# Patient Record
Sex: Female | Born: 1985 | Race: White | Hispanic: No | Marital: Married | Smoking: Never smoker
Health system: Southern US, Community
[De-identification: ages and names within clinical notes are randomized; demographics above are authoritative.]

## PROBLEM LIST (undated history)

## (undated) DIAGNOSIS — Z789 Other specified health status: Secondary | ICD-10-CM

## (undated) HISTORY — PX: NO PAST SURGERIES: SHX2092

---

## 2006-02-13 ENCOUNTER — Ambulatory Visit: Payer: Self-pay | Admitting: Internal Medicine

## 2007-03-31 ENCOUNTER — Observation Stay: Payer: Self-pay

## 2007-04-01 ENCOUNTER — Ambulatory Visit: Payer: Self-pay | Admitting: Obstetrics and Gynecology

## 2007-05-26 ENCOUNTER — Inpatient Hospital Stay: Payer: Self-pay | Admitting: Obstetrics and Gynecology

## 2013-06-08 ENCOUNTER — Emergency Department: Payer: Self-pay | Admitting: Emergency Medicine

## 2013-06-08 LAB — COMPREHENSIVE METABOLIC PANEL
Albumin: 3.9 g/dL (ref 3.4–5.0)
Alkaline Phosphatase: 78 U/L
Anion Gap: 7 (ref 7–16)
BUN: 9 mg/dL (ref 7–18)
Bilirubin,Total: 0.6 mg/dL (ref 0.2–1.0)
Calcium, Total: 9.7 mg/dL (ref 8.5–10.1)
Chloride: 104 mmol/L (ref 98–107)
Co2: 24 mmol/L (ref 21–32)
Creatinine: 0.52 mg/dL — ABNORMAL LOW (ref 0.60–1.30)
EGFR (African American): >60
EGFR (Non-African Amer.): >60
Glucose: 102 mg/dL — ABNORMAL HIGH (ref 65–99)
Osmolality: 269 (ref 275–301)
Potassium: 3.8 mmol/L (ref 3.5–5.1)
SGOT(AST): 19 U/L (ref 15–37)
SGPT (ALT): 26 U/L (ref 12–78)
Sodium: 135 mmol/L — ABNORMAL LOW (ref 136–145)
Total Protein: 8.2 g/dL (ref 6.4–8.2)

## 2013-06-08 LAB — CBC WITH DIFFERENTIAL/PLATELET
Basophil #: 0.1 10*3/uL (ref 0.0–0.1)
Basophil %: 0.3 %
Eosinophil #: 0 10*3/uL (ref 0.0–0.7)
Eosinophil %: 0 %
HCT: 42 % (ref 35.0–47.0)
HGB: 13.6 g/dL (ref 12.0–16.0)
Lymphocyte #: 0.4 10*3/uL — ABNORMAL LOW (ref 1.0–3.6)
Lymphocyte %: 2.4 %
MCH: 29.9 pg (ref 26.0–34.0)
MCHC: 32.4 g/dL (ref 32.0–36.0)
MCV: 92 fL (ref 80–100)
Monocyte #: 0.4 x10 3/mm (ref 0.2–0.9)
Monocyte %: 2.3 %
Neutrophil #: 15.6 10*3/uL — ABNORMAL HIGH (ref 1.4–6.5)
Neutrophil %: 95 %
Platelet: 208 10*3/uL (ref 150–440)
RBC: 4.55 10*6/uL (ref 3.80–5.20)
RDW: 13.2 % (ref 11.5–14.5)
WBC: 16.5 10*3/uL — ABNORMAL HIGH (ref 3.6–11.0)

## 2013-06-08 LAB — URINALYSIS, COMPLETE
Bilirubin,UR: NEGATIVE
Blood: NEGATIVE
Glucose,UR: NEGATIVE mg/dL (ref 0–75)
Leukocyte Esterase: NEGATIVE
Nitrite: NEGATIVE
Ph: 5 (ref 4.5–8.0)
Protein: 100
RBC,UR: 4 /HPF (ref 0–5)
Specific Gravity: 1.028 (ref 1.003–1.030)
Squamous Epithelial: 5
WBC UR: 2 /HPF (ref 0–5)

## 2013-10-29 ENCOUNTER — Inpatient Hospital Stay: Payer: Self-pay | Admitting: Obstetrics and Gynecology

## 2013-10-29 LAB — CBC WITH DIFFERENTIAL/PLATELET
Basophil #: 0.1 10*3/uL (ref 0.0–0.1)
Basophil %: 0.6 %
Eosinophil #: 0.1 10*3/uL (ref 0.0–0.7)
Eosinophil %: 0.9 %
HCT: 35 % (ref 35.0–47.0)
HGB: 11.4 g/dL — ABNORMAL LOW (ref 12.0–16.0)
Lymphocyte #: 1.8 10*3/uL (ref 1.0–3.6)
Lymphocyte %: 18.7 %
MCH: 28.3 pg (ref 26.0–34.0)
MCHC: 32.6 g/dL (ref 32.0–36.0)
MCV: 87 fL (ref 80–100)
Monocyte #: 0.7 x10 3/mm (ref 0.2–0.9)
Monocyte %: 7 %
Neutrophil #: 7.1 10*3/uL — ABNORMAL HIGH (ref 1.4–6.5)
Neutrophil %: 72.8 %
Platelet: 209 10*3/uL (ref 150–440)
RBC: 4.04 10*6/uL (ref 3.80–5.20)
RDW: 14.6 % — ABNORMAL HIGH (ref 11.5–14.5)
WBC: 9.8 10*3/uL (ref 3.6–11.0)

## 2013-10-31 LAB — CBC WITH DIFFERENTIAL/PLATELET
Basophil #: 0.1 10*3/uL (ref 0.0–0.1)
Basophil %: 0.5 %
Eosinophil #: 0.1 10*3/uL (ref 0.0–0.7)
Eosinophil %: 0.7 %
HCT: 32.5 % — ABNORMAL LOW (ref 35.0–47.0)
HGB: 10.7 g/dL — ABNORMAL LOW (ref 12.0–16.0)
Lymphocyte #: 2 10*3/uL (ref 1.0–3.6)
Lymphocyte %: 17.1 %
MCH: 28 pg (ref 26.0–34.0)
MCHC: 32.8 g/dL (ref 32.0–36.0)
MCV: 86 fL (ref 80–100)
Monocyte #: 0.8 x10 3/mm (ref 0.2–0.9)
Monocyte %: 7.2 %
Neutrophil #: 8.6 10*3/uL — ABNORMAL HIGH (ref 1.4–6.5)
Neutrophil %: 74.5 %
Platelet: 180 10*3/uL (ref 150–440)
RBC: 3.8 10*6/uL (ref 3.80–5.20)
RDW: 14.3 % (ref 11.5–14.5)
WBC: 11.5 10*3/uL — ABNORMAL HIGH (ref 3.6–11.0)

## 2013-11-02 LAB — HEMATOCRIT: HCT: 25 % — ABNORMAL LOW (ref 35.0–47.0)

## 2014-03-01 ENCOUNTER — Other Ambulatory Visit: Payer: BC Managed Care – PPO

## 2014-03-01 ENCOUNTER — Encounter: Payer: Self-pay | Admitting: General Surgery

## 2014-03-01 ENCOUNTER — Ambulatory Visit (INDEPENDENT_AMBULATORY_CARE_PROVIDER_SITE_OTHER): Payer: BC Managed Care – PPO | Admitting: General Surgery

## 2014-03-01 VITALS — BP 100/72 | HR 68 | Resp 12 | Ht 65.0 in | Wt 165.0 lb

## 2014-03-01 DIAGNOSIS — N63 Unspecified lump in unspecified breast: Secondary | ICD-10-CM

## 2014-03-01 NOTE — Patient Instructions (Signed)
6 week follow up

## 2014-03-01 NOTE — Progress Notes (Signed)
Patient ID: Natalie Aguirre, female   DOB: 08-Sep-1985, 28 y.o.   MRN: [id number]  Chief Complaint  Patient presents with  . Other    right breast mass    HPI Natalie Aguirre is a 28 y.o. female who presents for a right breast mass that has been present for approximately 4 months, around same time she gave child birth. Patient is not breast feeding. She denies any pain or nipple discharge. She thinks the area has gotten larger but not totally sure. She has a family history of a maternal great aunt with breast cancer diagnosed in her forties. No prior breast problems.   HPI  History reviewed. No pertinent past medical history.  History reviewed. No pertinent past surgical history.  Family History  Problem Relation Age of Onset  . Cancer Maternal Aunt 25    great aunt    Social History History  Substance Use Topics  . Smoking status: Never Smoker   . Smokeless tobacco: Never Used  . Alcohol Use: Yes    Allergies  Allergen Reactions  . Codeine Palpitations    No current outpatient prescriptions on file.   No current facility-administered medications for this visit.    Review of Systems Review of Systems  Constitutional: Negative.   Respiratory: Negative.   Cardiovascular: Negative.     Blood pressure 100/72, pulse 68, resp. rate 12, height 5\' 5"  (1.651 m), weight 165 lb (74.844 kg), last menstrual period 02/19/2014.  Physical Exam Physical Exam  Constitutional: She is oriented to person, place, and time. She appears well-developed and well-nourished.  Eyes: Conjunctivae are normal. No scleral icterus.  Neck: Neck supple. No thyromegaly present.  Cardiovascular: Normal rate, regular rhythm and normal heart sounds.   Pulmonary/Chest: Effort normal and breath sounds normal. Right breast exhibits mass (1cm smooth ill defined mass, 7-8oclock, subareolar). Right breast exhibits no inverted nipple, no nipple discharge and no skin change. Left breast exhibits no inverted  nipple, no mass, no nipple discharge and no skin change.    Abdominal: Soft. Bowel sounds are normal.  Lymphadenopathy:    She has no cervical adenopathy.    She has no axillary adenopathy.  Neurological: She is alert and oriented to person, place, and time.  Skin: Skin is warm and dry.    Data Reviewed [place] R breast performed, showing some non specific mildly dilated ducts. No defined mass. No shadowing  Assessment   Likely enlarged ducts of benign nature. Discussed fully with pt and she is agreeable to continued follow up.    Plan    6 week follow up for exam and possible [place]. Pt aware to call if she notes any pain, increase in size or any bloody/watery nipple drainage.       Natalie Aguirre 03/01/2014, 3:50 PM

## 2014-04-12 ENCOUNTER — Ambulatory Visit (INDEPENDENT_AMBULATORY_CARE_PROVIDER_SITE_OTHER): Payer: BLUE CROSS/BLUE SHIELD | Admitting: General Surgery

## 2014-04-12 ENCOUNTER — Ambulatory Visit: Payer: Self-pay

## 2014-04-12 ENCOUNTER — Encounter: Payer: Self-pay | Admitting: General Surgery

## 2014-04-12 ENCOUNTER — Ambulatory Visit: Payer: BC Managed Care – PPO | Admitting: General Surgery

## 2014-04-12 VITALS — BP 122/76 | HR 78 | Resp 12 | Ht 65.0 in | Wt 164.0 lb

## 2014-04-12 DIAGNOSIS — N63 Unspecified lump in unspecified breast: Secondary | ICD-10-CM

## 2014-04-12 DIAGNOSIS — N6310 Unspecified lump in the right breast, unspecified quadrant: Secondary | ICD-10-CM

## 2014-04-12 NOTE — Patient Instructions (Addendum)
Patient to return in six months right breast ultrasound. Continue self breast exams. Call office for any new breast issues or concerns.

## 2014-04-12 NOTE — Progress Notes (Signed)
Patient ID: Natalie Aguirre, female   DOB: 01-Nov-1985, 29 y.o.   MRN: [id number]  Chief Complaint  Patient presents with  . Follow-up    right breast mass    HPI Natalie Aguirre is a 29 y.o. female. here today for her follow up right breast mass. Patient states she still feels the area and it has not got bigger since her last visit. No pain or any discharge HPI  History reviewed. No pertinent past medical history.  History reviewed. No pertinent past surgical history.  Family History  Problem Relation Age of Onset  . Cancer Maternal Aunt 19    great aunt    Social History History  Substance Use Topics  . Smoking status: Never Smoker   . Smokeless tobacco: Never Used  . Alcohol Use: Yes    Allergies  Allergen Reactions  . Codeine Palpitations    No current outpatient prescriptions on file.   No current facility-administered medications for this visit.    Review of Systems Review of Systems  Blood pressure 122/76, pulse 78, resp. rate 12, height 5\' 5"  (1.651 m), weight 164 lb (74.39 kg).  Physical Exam Physical Exam  Constitutional: She appears well-developed.  Eyes: Conjunctivae are normal. No scleral icterus.  Pulmonary/Chest: Right breast exhibits mass ( 1cm smooth ill defined mass, 7-8oclock, subareolar). Right breast exhibits no inverted nipple, no nipple discharge, no skin change and no tenderness. Left breast exhibits no inverted nipple, no mass, no nipple discharge, no skin change and no tenderness.  Lymphadenopathy:    She has no cervical adenopathy.    She has no axillary adenopathy.  Right breast finding is unchanged  Data Reviewed Right breast mass no changes since last visit.   Assessment    [place] R breast performed, showing normal fibroglandular tissue and few non dilated ducts. No defined mass or cyst seen     Plan    Patient to return in six months for recheck    Continue self breast exams. Call office for any new breast issues or  concerns.     Camri Molloy G 04/12/2014, 11:20 AM

## 2014-04-26 ENCOUNTER — Ambulatory Visit: Payer: Self-pay | Admitting: General Surgery

## 2014-08-10 NOTE — H&P (Signed)
L&D Evaluation:  History Expanded:  HPI 29 yo G2P1001 at [redacted]w[redacted]d gestational age by LMP consistent with 10 week ultrasound.  Pregnancy complicated by low AFI today in office (~4.5cm).  She also had a non-reactive NST today.  She was sent from the office for management. She reports positive fetal movement, no leakage of fluid, no vaginal bleeding. She has rare contractions.   Group B Strep Results Maternal (Result >5wks must be treated as unknown) negative  10/01/13   [facility] 27-Oct-2013   Patient's Medical History No Chronic Illness   Patient's Surgical History none   Medications Pre Natal Vitamins   Allergies codeine   Social History none   Family History Non-Contributory   ROS:  ROS All systems were reviewed.  HEENT, CNS, GI, GU, Respiratory, CV, Renal and Musculoskeletal systems were found to be normal., unless noted in HPI   Exam:  Vital Signs afebrile, normotensive, otherwise normal vital signs   General no apparent distress   Mental Status clear   Chest clear   Heart normal sinus rhythm   Abdomen gravid, non-tender   Estimated Fetal Weight 7.5-8 pounds   Fetal Position c   Back no CVAT   Edema no edema   Pelvic FT   Mebranes Intact   FHT normal rate with no decels   FHT Description 125/mod var/+accels/no decels   Ucx irregular   Skin no lesions   Impression:  Impression 1) Intrauterine pregnancy at [redacted]w[redacted]d gestational age, 2) oligohydramnios   Plan:  Comments 1) Labor: Induction of labor with cervadil  2) Fetus - category I tracing  3) PNL O positive / ABSC negative / RI / VZI / HIV neg / RPR NR / HBsAg neg / declined genetic testing / 1-hr OGTT 97 / GBS negative  4) TDAP declined  5) Disposition - home postpartum  Pt has been fully informed of the pros and cons, risk/benefits continued observation with NST/AFI versus the risks of induction.  She understands that there is an increasing risk to the fetus with low AFI at current gestational age.  She also understands that there are uncommon risks to induction, which include but are not limited to:  frequent and/or prolonged uterine contractions, fetal distress, uterine rupture and lack of success of induction.  These risks involve all methods of induction whether Pitocin or Misoprostol She also has been informed that if the induction is not successful a Cesarean Section may be necessary.   Electronic Signatures: Will Bonnet (MD)  (Signed 30-Jul-15 20:31)  Authored: L&D Evaluation   Last Updated: 30-Jul-15 20:31 by Will Bonnet (MD)

## 2014-10-12 ENCOUNTER — Ambulatory Visit: Payer: BLUE CROSS/BLUE SHIELD | Admitting: General Surgery

## 2014-11-25 ENCOUNTER — Encounter: Payer: Self-pay | Admitting: *Deleted

## 2016-07-25 ENCOUNTER — Emergency Department: Payer: BLUE CROSS/BLUE SHIELD

## 2016-07-25 ENCOUNTER — Encounter: Payer: Self-pay | Admitting: *Deleted

## 2016-07-25 ENCOUNTER — Emergency Department
Admission: EM | Admit: 2016-07-25 | Discharge: 2016-07-25 | Disposition: A | Discharge status: Home / Self Care | Payer: BLUE CROSS/BLUE SHIELD | Attending: Emergency Medicine | Admitting: Emergency Medicine

## 2016-07-25 DIAGNOSIS — M542 Cervicalgia: Secondary | ICD-10-CM | POA: Diagnosis not present

## 2016-07-25 DIAGNOSIS — Y9389 Activity, other specified: Secondary | ICD-10-CM | POA: Diagnosis not present

## 2016-07-25 DIAGNOSIS — Y92410 Unspecified street and highway as the place of occurrence of the external cause: Secondary | ICD-10-CM | POA: Diagnosis not present

## 2016-07-25 DIAGNOSIS — Y999 Unspecified external cause status: Secondary | ICD-10-CM | POA: Insufficient documentation

## 2016-07-25 DIAGNOSIS — S199XXA Unspecified injury of neck, initial encounter: Secondary | ICD-10-CM | POA: Diagnosis present

## 2016-07-25 MED ORDER — CYCLOBENZAPRINE HCL 5 MG PO TABS: 5.0000 mg | ORAL_TABLET | Freq: Three times a day (TID) | ORAL | 0 refills | Status: AC | PRN

## 2016-07-25 MED ORDER — MELOXICAM 7.5 MG PO TABS
7.5000 mg | ORAL_TABLET | Freq: Every day | ORAL | Status: DC
  Administered 2016-07-25: 7.5 mg via ORAL
  Filled 2016-07-25: qty 1

## 2016-07-25 MED ORDER — MELOXICAM 7.5 MG PO TABS: 7.5000 mg | ORAL_TABLET | Freq: Every day | ORAL | 1 refills | Status: AC

## 2016-07-25 NOTE — ED Notes (Signed)
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
## 2019-02-27 ENCOUNTER — Emergency Department: Payer: BC Managed Care – PPO

## 2019-02-27 ENCOUNTER — Other Ambulatory Visit: Payer: Self-pay

## 2019-02-27 ENCOUNTER — Emergency Department
Admission: EM | Admit: 2019-02-27 | Discharge: 2019-02-27 | Disposition: A | Discharge status: Home / Self Care | Payer: BC Managed Care – PPO | Attending: Emergency Medicine | Admitting: Emergency Medicine

## 2019-02-27 DIAGNOSIS — S99912A Unspecified injury of left ankle, initial encounter: Secondary | ICD-10-CM | POA: Diagnosis present

## 2019-02-27 DIAGNOSIS — W1842XA Slipping, tripping and stumbling without falling due to stepping into hole or opening, initial encounter: Secondary | ICD-10-CM | POA: Insufficient documentation

## 2019-02-27 DIAGNOSIS — S93492A Sprain of other ligament of left ankle, initial encounter: Secondary | ICD-10-CM | POA: Diagnosis not present

## 2019-02-27 DIAGNOSIS — Y9389 Activity, other specified: Secondary | ICD-10-CM | POA: Insufficient documentation

## 2019-02-27 DIAGNOSIS — Y92008 Other place in unspecified non-institutional (private) residence as the place of occurrence of the external cause: Secondary | ICD-10-CM | POA: Insufficient documentation

## 2019-02-27 DIAGNOSIS — Y999 Unspecified external cause status: Secondary | ICD-10-CM | POA: Diagnosis not present

## 2019-02-27 MED ORDER — NAPROXEN 500 MG PO TABS
500.0000 mg | ORAL_TABLET | Freq: Once | ORAL | Status: AC
  Administered 2019-02-27: 500 mg via ORAL
  Filled 2019-02-27: qty 1

## 2019-02-27 NOTE — ED Notes (Signed)
Imaging at bedside. Pt given warm blanket.

## 2019-02-27 NOTE — Discharge Instructions (Signed)
Take Naprosyn or ibuprofen for pain. Rest, ice, and elevate over the next few days. Follow up with podiatry if not improving over the week. Return to the ER for symptoms of concern if unable to see primary care or the podiatrist.

## 2019-02-27 NOTE — ED Provider Notes (Signed)
[facility] Emergency Department Provider Note ____________________________________________  Time seen: Approximately 8:15 PM  I have reviewed the triage vital signs and the nursing notes.   HISTORY  Chief Complaint Fall    HPI Caysie KEKELI ORTMAN is a 33 y.o. female who presents to the emergency department for evaluation and treatment of left ankle pain. She fell and a hole this afternoon while decorating for Christmas and twisted it. She has had a previous ankle sprain, but no fracture. No alleviating measures prior to arrival.  History reviewed. No pertinent past medical history.  Patient Active Problem List   Diagnosis Date Noted  . Breast mass, right 04/12/2014    History reviewed. No pertinent surgical history.  Prior to Admission medications   Not on File    Allergies Codeine  Family History  Problem Relation Age of Onset  . Cancer Maternal Aunt 67       great aunt    Social History Social History   Tobacco Use  . Smoking status: Never Smoker  . Smokeless tobacco: Never Used  Substance Use Topics  . Alcohol use: Yes    Comment: occasional   . Drug use: No    Review of Systems Constitutional: Negative for fever. Cardiovascular: Negative for chest pain. Respiratory: Negative for shortness of breath. Musculoskeletal: Positive for left ankle pain. Positive for left ankle swelling. Skin: Negative for open wounds.  Neurological: Negative for decrease in sensation  ____________________________________________   PHYSICAL EXAM:  VITAL SIGNS: ED Triage Vitals  Enc Vitals Group     BP 02/27/19 1950 134/72     Pulse Rate 02/27/19 1950 78     Resp 02/27/19 1950 18     Temp 02/27/19 1950 98.3 F (36.8 C)     Temp Source 02/27/19 1950 Oral     SpO2 02/27/19 1950 100 %     Weight 02/27/19 1949 173 lb (78.5 kg)     Height 02/27/19 1949 5\' 5"  (1.651 m)     Head Circumference --      Peak Flow --      Pain Score 02/27/19 1949 7   Pain Loc --      Pain Edu? --      Excl. in [place]? --     Constitutional: Alert and oriented. Well appearing and in no acute distress. Eyes: Conjunctivae are clear without discharge or drainage Head: Atraumatic Neck: Supple Respiratory: No cough. Respirations are even and unlabored. Musculoskeletal: ATFL pattern swelling and tenderness. No knee or hip pain. Neurologic: Motor and sensory function is intact.  Skin: No open wounds over left foot or ankle.  Psychiatric: Affect and behavior are appropriate.  ____________________________________________   LABS (all labs ordered are listed, but only abnormal results are displayed)  Labs Reviewed - No data to display ____________________________________________  RADIOLOGY  Left ankle image is negative for acute fracture. ____________________________________________   PROCEDURES  .Splint Application  Date/Time: 02/27/2019 8:20 PM Performed by: Jarvis Morgan, RN Authorized by: Victorino Dike, FNP   Consent:    Consent obtained:  Verbal   Consent given by:  Patient Pre-procedure details:    Sensation:  Normal Procedure details:    Laterality:  Left   Location:  Ankle   Splint type:  Ankle stirrup   Supplies:  Prefabricated splint Post-procedure details:    Pain:  Improved   Sensation:  Normal   Patient tolerance of procedure:  Tolerated well, no immediate complications   ____________________________________________   INITIAL IMPRESSION /  ASSESSMENT AND PLAN / ED COURSE  Odessie TANIELLE ROESKE is a 33 y.o. who presents to the emergency department for treatment and evaluation of left ankle pain after stepping in a hole in her yard this afternoon. See HPI for further details.   Patient instructed to follow-up with podiatry.  She was also instructed to return to the emergency department for symptoms that change or worsen if unable schedule an appointment with orthopedics or primary care.  Medications  naproxen (NAPROSYN) tablet  500 mg (500 mg Oral Given 02/27/19 2019)    Pertinent labs & imaging results that were available during my care of the patient were reviewed by me and considered in my medical decision making (see chart for details).  _________________________________________   FINAL CLINICAL IMPRESSION(S) / ED DIAGNOSES  Final diagnoses:  Sprain of anterior talofibular ligament of left ankle, initial encounter    ED Discharge Orders    None       If controlled substance prescribed during this visit, 12 month history viewed on the [place] prior to issuing an initial prescription for Schedule II or III opiod.   Victorino Dike, FNP 02/27/19 2027    Arta Silence, MD 02/27/19 2227

## 2019-02-27 NOTE — ED Notes (Signed)
Pt in with L ankle pain following fall into hole in the yard. Unsure if rolled ankle. States painful to try and put weight on. Can move ankle/foot but with inc pain. Slight swelling noted. Pulse 2+ on injured foot.

## 2019-12-21 ENCOUNTER — Ambulatory Visit: Payer: BC Managed Care – PPO | Admitting: Dietician

## 2019-12-25 ENCOUNTER — Encounter: Payer: Self-pay | Admitting: Dietician

## 2019-12-25 NOTE — Progress Notes (Signed)
Have not yet heard back from patient to reschedule her cancelled appointment from 12/21/19. Sent notification to referring provider.

## 2020-01-04 ENCOUNTER — Other Ambulatory Visit: Payer: Self-pay | Admitting: Obstetrics & Gynecology

## 2020-01-04 NOTE — Progress Notes (Unsigned)
Chief Complaint:   Chief Complaint  Patient presents with  . Abnormal Pap Smear    HPI:  Natalie Aguirre is a 34 y.o. female here for Abnormal Pap Smear  Patient was seen 12/21/19 for colposcopy due to ASC-H pap HPV+. At time of the procedure, two areas of acetowhite changes were seen between 11:00 to 1:00 at the junction of the tranzition zone and 6:00 to 8:00 on the posterior cervix. Biopsies were taken at 12:00 and 7:30. An ECC was also done.   Pathology Showed: Specimen A- 7:00 Biopsy: BENIGN CERVICAL TISSUE. NEGATIVE FOR DYSPLASIA AND MALIGNANCY.  Specimen B- 12:00 Biopsy: LOW-GRADE SQUAMOUS INTRAEPITHELIAL LESION (CIN 1).  Specimen C- ECC: STRIPS OF SQUAMOUS  EPITHELIUM WITH A HIGH GRADE SQUAMOUS INTRAEPITHELIAL  LESION (CIN 2-3).  No known kidney, lung or heart problems.  Never had problems with anesthesia   No LMP recorded. (Menstrual status: IUD).    Past Medical History:  has no past medical history on file.  Past Surgical History:  has no past surgical history on file. Family History: family history includes Alcohol abuse in her father; Coronary Artery Disease (Blocked arteries around heart) in her maternal grandfather and maternal grandmother; High blood pressure (Hypertension) in her maternal grandmother; Hyperlipidemia (Elevated cholesterol) in her maternal grandfather; No Known Problems in her brother, daughter, daughter, mother, sister, and son. Social History:  reports that she has never smoked. She has never used smokeless tobacco. She reports current alcohol use. She reports that she does not use drugs. OB/GYN History:  OB History   No obstetric history on file.     Allergies: is allergic to codeine. Medications:  Current Outpatient Medications:  .  levonorgestreL (MIRENA 52 MG) 20 mcg/24 hr (6 years) IUD, Insert 1 each into the uterus once Follow package directions., Disp: , Rfl:  .  metroNIDAZOLE (METROGEL) 1 % gel, Apply topically once daily, Disp: 45 g,  Rfl: 2  Review of Systems (positives per HPI): General:   No fatigue or weight loss Eyes:   No vision changes Ears:   No hearing difficulty Respiratory:                No cough or shortness of breath Cardiovascular:        No chest pain, palpitations, dyspnea on exertion Gastrointestinal:          No abdominal bloating, chronic diarrhea, constipation, masses, pain or hematochezia Genitourinary:  No hematuria, dysuria, abnormal vaginal discharge, pelvic pain, abnormal bleeding Lymphatic:  No swollen lymph nodes Musculoskeletal: No muscle weakness Neurologic:  No extremity weakness, syncope Psychiatric:  No delusions or suicidal/homicidal ideation    Exam:   Constitutional: BP 120/84   Pulse 84   Ht 165.1 cm (5\' 5" )   Wt 84.2 kg (185 lb 9.6 oz)   BMI 30.89 kg/m    WDWN female in NAD   HEENT: sclera clear, non-icteric, moist mucous membranes  Endocrine:  no thyromegaly Respiratory: normal respiratory effort, CTABL    CV: no peripheral edema, RRR no MRG Skin: warm and well perfused, no rashes Neuro: alert, oriented x3,   Psych: appropriate mood and insight, judgement intact  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  Plan:   Chronic Medical Conditions There is no problem list on file for this patient.  We discussed  at length the nature of cervical changes and CIN.  CIN 2-3 is intermediary and if left alone does have a significant risk of becoming cancerous.  The recommended next step is excision procedure to the affected areas of the cervix. Preop exam complete.   Recommended procedure: Cold knife conization of the cervix Desires a hysterectomy after procedure pathology returns.   The patient and I discussed the technical aspects of the  procedure including the potential for risks and complications.These include but are not limited to the risk of infection requiring post-operative antibiotics or further procedures.We talked about the risk of injury to adjacent organs. Possibleneed for subsequent procedure or blood transfusion andpostop complications such asthromboembolic or cardiopulmonary complications.Reviewed risks for preterm delivery of subsequent pregnancies.All of her questions were answered. Her preoperative exam was completed andthe appropriate consents will be signed day of. She will be scheduled to undergo this procedure in the near future.    Return if symptoms worsen or fail to improve.  I personally performed the service. (TP)  Vernis Eid CRIST Nimra Puccinelli, MD

## 2020-01-12 ENCOUNTER — Other Ambulatory Visit: Payer: Self-pay | Admitting: Obstetrics & Gynecology

## 2020-01-16 NOTE — H&P (Signed)
Chief Complaint:      Chief Complaint  Patient presents with  . Abnormal Pap Smear    HPI:  Natalie Aguirre is a 34 y.o. female here for Abnormal Pap Smear  Patient was seen 12/21/19 for colposcopy due to ASC-H pap HPV+. At time of the procedure, two areas of acetowhite changes were seen between 11:00 to 1:00 at the junction of the tranzition zone and 6:00 to 8:00 on the posterior cervix. Biopsies were taken at 12:00 and 7:30. An ECC was also done.   Pathology Showed: Specimen A- 7:00 Biopsy: BENIGN CERVICAL TISSUE. NEGATIVE FOR DYSPLASIA AND MALIGNANCY.  Specimen B- 12:00 Biopsy: LOW-GRADE SQUAMOUS INTRAEPITHELIAL LESION (CIN 1).  Specimen C- ECC: STRIPS OF SQUAMOUS  EPITHELIUM WITH A HIGH GRADE SQUAMOUS INTRAEPITHELIAL  LESION (CIN 2-3).  No known kidney, lung or heart problems.  Never had problems with anesthesia   No LMP recorded. (Menstrual status: IUD).    Past Medical History:  has no past medical history on file.  Past Surgical History:  has no past surgical history on file. Family History: family history includes Alcohol abuse in her father; Coronary Artery Disease (Blocked arteries around heart) in her maternal grandfather and maternal grandmother; High blood pressure (Hypertension) in her maternal grandmother; Hyperlipidemia (Elevated cholesterol) in her maternal grandfather; No Known Problems in her brother, daughter, daughter, mother, sister, and son. Social History:  reports that she has never smoked. She has never used smokeless tobacco. She reports current alcohol use. She reports that she does not use drugs. OB/GYN History:  OB History   No obstetric history on file.     Allergies: is allergic to codeine. Medications:  Current Outpatient Medications:  .  levonorgestreL (MIRENA 52 MG) 20 mcg/24 hr (6 years) IUD, Insert 1 each into the uterus once Follow package directions., Disp: , Rfl:  .  metroNIDAZOLE (METROGEL) 1 % gel, Apply topically once  daily, Disp: 45 g, Rfl: 2  Review of Systems (positives per HPI): General:                      No fatigue or weight loss Eyes:                           No vision changes Ears:                            No hearing difficulty Respiratory:                No cough or shortness of breath Cardiovascular:           No chest pain, palpitations, dyspnea on exertion Gastrointestinal:          No abdominal bloating, chronic diarrhea, constipation, masses, pain or hematochezia Genitourinary:             No hematuria, dysuria, abnormal vaginal discharge, pelvic pain, abnormal bleeding Lymphatic:                   No swollen lymph nodes Musculoskeletal:         No muscle weakness Neurologic:                  No extremity weakness, syncope Psychiatric:                  No delusions or suicidal/homicidal ideation    Exam:   Constitutional: BP  120/84   Pulse 84   Ht 165.1 cm (5\' 5" )   Wt 84.2 kg (185 lb 9.6 oz)   BMI 30.89 kg/m    WDWN female in NAD   HEENT: sclera clear, non-icteric, moist mucous membranes  Endocrine:  no thyromegaly Respiratory: normal respiratory effort, CTABL    CV: no peripheral edema, RRR no MRG Skin: warm and well perfused, no rashes Neuro: alert, oriented x3,   Psych: appropriate mood and insight, judgement intact  Pelvic exam was performed at time of colposcopy and unremarkable except as described above.  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  Plan:   Chronic Medical Conditions There is no problem list on file for this patient.  We discussed at length the nature of cervical changes and CIN. CIN 2-3 is intermediary and if left alone does have a significant risk of becoming cancerous. The recommended next step is  excision procedure to the affected areas of the cervix. Preop exam complete.   Recommended procedure: Cold knife conization of the cervix Desires a hysterectomy after procedure pathology returns.   The patient and I discussed the technical aspects of the procedure including the potential for risks and complications.These include but are not limited to the risk of infection requiring post-operative antibiotics or further procedures.We talked about the risk of injury to adjacent organs. Possibleneed for subsequent procedure or blood transfusion andpostop complications such asthromboembolic or cardiopulmonary complications.Reviewed risks for preterm delivery of subsequent pregnancies.All of her questions were answered. Her preoperative exam was completed andthe appropriate consents will be signed day of. She will be scheduled to undergo this procedure in the near future.     I personally performed the service. (TP)  Elmer Merwin CRIST Ellery Tash, MD    A portion of this note was dictated via scribe. The plan and decision making are mine.  Errors are unintentional.

## 2020-01-21 ENCOUNTER — Other Ambulatory Visit: Payer: Self-pay

## 2020-01-21 ENCOUNTER — Encounter
Admission: RE | Admit: 2020-01-21 | Discharge: 2020-01-21 | Disposition: A | Discharge status: Home / Self Care | Payer: BC Managed Care – PPO | Source: Ambulatory Visit | Attending: Obstetrics & Gynecology | Admitting: Obstetrics & Gynecology

## 2020-01-21 HISTORY — DX: Other specified health status: Z78.9

## 2020-01-21 NOTE — Patient Instructions (Signed)
Your procedure is scheduled IR:[id number] 10/29 Report to Day Surgery. To find out your arrival time please call [phone] between 1PM - 3PM on thurs 10/28.  Remember: Instructions that are not followed completely may result in serious medical risk,  up to and including death, or upon the discretion of your surgeon and anesthesiologist your  surgery may need to be rescheduled.     _X__ 1. Do not eat food after midnight the night before your procedure.                 No chewing gum or hard candies. You may drink clear liquids up to 2 hours                 before you are scheduled to arrive for your surgery- DO not drink clear                 liquids within 2 hours of the start of your surgery.                 Clear Liquids include:  water, apple juice without pulp, clear Gatorade, G2 or                  Gatorade Zero (avoid Red/Purple/Blue), Black Coffee or Tea (Do not add                 anything to coffee or tea). _____2.   Complete the "Ensure Clear Pre-surgery Clear Carbohydrate Drink" provided to you, 2 hours before arrival. **If you       are diabetic you will be provided with an alternative drink, Gatorade Zero or G2.  __X__2.  On the morning of surgery brush your teeth with toothpaste and water, you                may rinse your mouth with mouthwash if you wish.  Do not swallow any toothpaste of mouthwash.     _X__ 3.  No Alcohol for 24 hours before or after surgery.   ___ 4.  Do Not Smoke or use e-cigarettes For 24 Hours Prior to Your Surgery.                 Do not use any chewable tobacco products for at least 6 hours prior to                 Surgery.  ___  5.  Do not use any recreational drugs (marijuana, cocaine, heroin, ecstasy, MDMA or other)                For at least one week prior to your surgery.  Combination of these drugs with anesthesia                May have life threatening results.  ____  6.  Bring all medications with you on the day of  surgery if instructed.   __x__  7.  Notify your doctor if there is any change in your medical condition      (cold, fever, infections).     Do not wear jewelry, make-up, hairpins, clips or nail polish. Do not wear lotions, powders, or perfumes. You may wear deodorant. Do not shave 48 hours prior to surgery. Do not bring valuables to the hospital.    [facility] is not responsible for any belongings or valuables.  Contacts, dentures or bridgework may not be worn into surgery. Leave your suitcase in the car. After surgery it  may be brought to your room. For patients admitted to the hospital, discharge time is determined by your treatment team.   Patients discharged the day of surgery will not be allowed to drive home.   Make arrangements for someone to be with you for the first 24 hours of your Same Day Discharge.    Please read over the following fact sheets that you were given:     ____ Take these medicines the morning of surgery with A SIP OF WATER:    1. none  2.   3.   4.  5.  6.  ____ Fleet Enema (as directed)   _x___ Shower with your usual products the night before and the morning of the surgery  ____ Use Benzoyl Peroxide Gel as instructed  ____ Use inhalers on the day of surgery  ____ Stop metformin 2 days prior to surgery    ____ Take 1/2 of usual insulin dose the night before surgery. No insulin the morning          of surgery.   ____ Stop Coumadin/Plavix/aspirin on   _x___ Stop Anti-inflammatories  Ibuprofen aleve or aspirin products tomorrow       May take tylenol   ____ Stop supplements until after surgery.    ____ Bring C-Pap to the hospital.   Have peripads and stool softener available at home   If you have any questions regarding your pre-procedure instructions,  Please call Pre-admit Testing at [facility]

## 2020-01-27 ENCOUNTER — Other Ambulatory Visit: Payer: Self-pay

## 2020-01-27 ENCOUNTER — Other Ambulatory Visit
Admission: RE | Admit: 2020-01-27 | Discharge: 2020-01-27 | Disposition: A | Discharge status: Home / Self Care | Payer: BC Managed Care – PPO | Source: Ambulatory Visit | Attending: Obstetrics & Gynecology | Admitting: Obstetrics & Gynecology

## 2020-01-27 DIAGNOSIS — Z01812 Encounter for preprocedural laboratory examination: Secondary | ICD-10-CM | POA: Diagnosis not present

## 2020-01-27 DIAGNOSIS — Z20822 Contact with and (suspected) exposure to covid-19: Secondary | ICD-10-CM | POA: Insufficient documentation

## 2020-01-27 LAB — CBC
HCT: 40.1 % (ref 36.0–46.0)
Hemoglobin: 13.5 g/dL (ref 12.0–15.0)
MCH: 30.2 pg (ref 26.0–34.0)
MCHC: 33.7 g/dL (ref 30.0–36.0)
MCV: 89.7 fL (ref 80.0–100.0)
Platelets: 255 10*3/uL (ref 150–400)
RBC: 4.47 MIL/uL (ref 3.87–5.11)
RDW: 12.3 % (ref 11.5–15.5)
WBC: 6.5 10*3/uL (ref 4.0–10.5)
nRBC: 0 % (ref 0.0–0.2)

## 2020-01-27 LAB — BASIC METABOLIC PANEL
Anion gap: 9 (ref 5–15)
BUN: 9 mg/dL (ref 6–20)
CO2: 25 mmol/L (ref 22–32)
Calcium: 9.6 mg/dL (ref 8.9–10.3)
Chloride: 105 mmol/L (ref 98–111)
Creatinine, Ser: 0.66 mg/dL (ref 0.44–1.00)
GFR, Estimated: >60 mL/min (ref >60)
Glucose, Bld: 92 mg/dL (ref 70–99)
Potassium: 4.2 mmol/L (ref 3.5–5.1)
Sodium: 139 mmol/L (ref 135–145)

## 2020-01-27 LAB — SARS CORONAVIRUS 2 (TAT 6-24 HRS): SARS Coronavirus 2: NEGATIVE

## 2020-01-27 LAB — TYPE AND SCREEN
ABO/RH(D): O POS
Antibody Screen: NEGATIVE

## 2020-01-29 ENCOUNTER — Ambulatory Visit: Payer: BC Managed Care – PPO | Admitting: Certified Registered Nurse Anesthetist

## 2020-01-29 ENCOUNTER — Other Ambulatory Visit: Payer: Self-pay

## 2020-01-29 ENCOUNTER — Encounter: Payer: Self-pay | Admitting: Obstetrics & Gynecology

## 2020-01-29 ENCOUNTER — Encounter
Admission: RE | Disposition: A | Discharge status: Home / Self Care | Payer: Self-pay | Source: Home / Self Care | Attending: Obstetrics & Gynecology

## 2020-01-29 ENCOUNTER — Ambulatory Visit
Admission: RE | Admit: 2020-01-29 | Discharge: 2020-01-29 | Disposition: A | Discharge status: Home / Self Care | Payer: BC Managed Care – PPO | Attending: Obstetrics & Gynecology | Admitting: Obstetrics & Gynecology

## 2020-01-29 DIAGNOSIS — D060 Carcinoma in situ of endocervix: Secondary | ICD-10-CM | POA: Insufficient documentation

## 2020-01-29 DIAGNOSIS — Z6830 Body mass index (BMI) 30.0-30.9, adult: Secondary | ICD-10-CM | POA: Diagnosis not present

## 2020-01-29 DIAGNOSIS — E669 Obesity, unspecified: Secondary | ICD-10-CM | POA: Diagnosis not present

## 2020-01-29 HISTORY — PX: CERVICAL CONIZATION W/BX: SHX1330

## 2020-01-29 LAB — ABO/RH: ABO/RH(D): O POS

## 2020-01-29 SURGERY — CONE BIOPSY, CERVIX
Anesthesia: General

## 2020-01-29 MED ORDER — FERRIC SUBSULFATE 259 MG/GM EX SOLN
CUTANEOUS | Status: AC
  Filled 2020-01-29: qty 8

## 2020-01-29 MED ORDER — ORAL CARE MOUTH RINSE: 15.0000 mL | Freq: Once | OROMUCOSAL | Status: AC

## 2020-01-29 MED ORDER — CHLORHEXIDINE GLUCONATE 0.12 % MT SOLN
OROMUCOSAL | Status: AC
  Administered 2020-01-29: 15 mL via OROMUCOSAL
  Filled 2020-01-29: qty 15

## 2020-01-29 MED ORDER — ACETAMINOPHEN 500 MG PO TABS
ORAL_TABLET | ORAL | Status: AC
  Administered 2020-01-29: 1000 mg via ORAL
  Filled 2020-01-29: qty 2

## 2020-01-29 MED ORDER — PROPOFOL 10 MG/ML IV BOLUS
INTRAVENOUS | Status: AC
  Filled 2020-01-29: qty 20

## 2020-01-29 MED ORDER — MIDAZOLAM HCL 2 MG/2ML IJ SOLN
INTRAMUSCULAR | Status: AC
  Filled 2020-01-29: qty 2

## 2020-01-29 MED ORDER — FENTANYL CITRATE (PF) 100 MCG/2ML IJ SOLN
INTRAMUSCULAR | Status: DC | PRN
  Administered 2020-01-29 (×4): 25 ug via INTRAVENOUS

## 2020-01-29 MED ORDER — IODINE STRONG (LUGOLS) 5 % PO SOLN
ORAL | Status: DC | PRN
  Administered 2020-01-29: 0.2 mL via ORAL

## 2020-01-29 MED ORDER — ONDANSETRON HCL 4 MG/2ML IJ SOLN
INTRAMUSCULAR | Status: DC | PRN
  Administered 2020-01-29: 4 mg via INTRAVENOUS

## 2020-01-29 MED ORDER — LIDOCAINE HCL (PF) 2 % IJ SOLN
INTRAMUSCULAR | Status: AC
  Filled 2020-01-29: qty 5

## 2020-01-29 MED ORDER — FAMOTIDINE 20 MG PO TABS
ORAL_TABLET | ORAL | Status: AC
  Filled 2020-01-29: qty 1

## 2020-01-29 MED ORDER — ACETAMINOPHEN 500 MG PO TABS: 1000.0000 mg | ORAL_TABLET | ORAL | Status: AC

## 2020-01-29 MED ORDER — LIDOCAINE HCL 1 % IJ SOLN
INTRAMUSCULAR | Status: DC | PRN
  Administered 2020-01-29: 20 mL

## 2020-01-29 MED ORDER — ONDANSETRON HCL 4 MG/2ML IJ SOLN
INTRAMUSCULAR | Status: AC
  Filled 2020-01-29: qty 2

## 2020-01-29 MED ORDER — PROPOFOL 10 MG/ML IV BOLUS
INTRAVENOUS | Status: DC | PRN
  Administered 2020-01-29: 80 mg via INTRAVENOUS

## 2020-01-29 MED ORDER — PROPOFOL 500 MG/50ML IV EMUL
INTRAVENOUS | Status: AC
  Filled 2020-01-29: qty 50

## 2020-01-29 MED ORDER — LIDOCAINE HCL (CARDIAC) PF 100 MG/5ML IV SOSY
PREFILLED_SYRINGE | INTRAVENOUS | Status: DC | PRN
  Administered 2020-01-29: 60 mg via INTRAVENOUS

## 2020-01-29 MED ORDER — CHLORHEXIDINE GLUCONATE 0.12 % MT SOLN: 15.0000 mL | Freq: Once | OROMUCOSAL | Status: AC

## 2020-01-29 MED ORDER — ONDANSETRON HCL 4 MG/2ML IJ SOLN: 4.0000 mg | Freq: Once | INTRAMUSCULAR | Status: DC | PRN

## 2020-01-29 MED ORDER — MIDAZOLAM HCL 2 MG/2ML IJ SOLN
INTRAMUSCULAR | Status: DC | PRN
  Administered 2020-01-29: 2 mg via INTRAVENOUS

## 2020-01-29 MED ORDER — FENTANYL CITRATE (PF) 100 MCG/2ML IJ SOLN
INTRAMUSCULAR | Status: AC
  Filled 2020-01-29: qty 2

## 2020-01-29 MED ORDER — GABAPENTIN 300 MG PO CAPS: 300.0000 mg | ORAL_CAPSULE | ORAL | Status: AC

## 2020-01-29 MED ORDER — FAMOTIDINE 20 MG PO TABS: 20.0000 mg | ORAL_TABLET | Freq: Once | ORAL | Status: DC

## 2020-01-29 MED ORDER — GLYCOPYRROLATE 0.2 MG/ML IJ SOLN
INTRAMUSCULAR | Status: DC | PRN
  Administered 2020-01-29: .2 mg via INTRAVENOUS

## 2020-01-29 MED ORDER — LACTATED RINGERS IV SOLN: INTRAVENOUS | Status: DC

## 2020-01-29 MED ORDER — VASOPRESSIN 20 UNIT/ML IV SOLN
INTRAVENOUS | Status: DC | PRN
  Administered 2020-01-29: 20 [IU]

## 2020-01-29 MED ORDER — KETOROLAC TROMETHAMINE 15 MG/ML IJ SOLN
INTRAMUSCULAR | Status: AC
  Administered 2020-01-29: 15 mg via INTRAVENOUS
  Filled 2020-01-29: qty 1

## 2020-01-29 MED ORDER — DEXAMETHASONE SODIUM PHOSPHATE 4 MG/ML IJ SOLN
INTRAMUSCULAR | Status: DC | PRN
  Administered 2020-01-29: 6 mg via INTRAVENOUS

## 2020-01-29 MED ORDER — PROPOFOL 500 MG/50ML IV EMUL
INTRAVENOUS | Status: DC | PRN
  Administered 2020-01-29: 140 ug/kg/min via INTRAVENOUS

## 2020-01-29 MED ORDER — KETOROLAC TROMETHAMINE 15 MG/ML IJ SOLN: 15.0000 mg | INTRAMUSCULAR | Status: AC

## 2020-01-29 MED ORDER — IODINE STRONG (LUGOLS) 5 % PO SOLN
ORAL | Status: AC
  Filled 2020-01-29: qty 1

## 2020-01-29 MED ORDER — FENTANYL CITRATE (PF) 100 MCG/2ML IJ SOLN: 25.0000 ug | INTRAMUSCULAR | Status: DC | PRN

## 2020-01-29 MED ORDER — GLYCOPYRROLATE 0.2 MG/ML IJ SOLN
INTRAMUSCULAR | Status: AC
  Filled 2020-01-29: qty 1

## 2020-01-29 MED ORDER — DEXAMETHASONE SODIUM PHOSPHATE 10 MG/ML IJ SOLN
INTRAMUSCULAR | Status: AC
  Filled 2020-01-29: qty 1

## 2020-01-29 MED ORDER — GABAPENTIN 300 MG PO CAPS
ORAL_CAPSULE | ORAL | Status: AC
  Administered 2020-01-29: 300 mg via ORAL
  Filled 2020-01-29: qty 1

## 2020-01-29 MED ORDER — LIDOCAINE-EPINEPHRINE 1 %-1:100000 IJ SOLN
INTRAMUSCULAR | Status: AC
  Filled 2020-01-29: qty 1

## 2020-01-29 SURGICAL SUPPLY — 42 items
APPLICATOR COTTON TIP 6IN STRL (MISCELLANEOUS) ×5 IMPLANT
APPLICATOR SWAB PROCTO LG 16IN (MISCELLANEOUS) ×3 IMPLANT
BLADE SURG SZ11 CARB STEEL (BLADE) ×3 IMPLANT
CANISTER SUCT 1200ML W/VALVE (MISCELLANEOUS) ×3 IMPLANT
CNTNR SPEC 2.5X3XGRAD LEK (MISCELLANEOUS) ×1
CONT SPEC 4OZ STER OR WHT (MISCELLANEOUS) ×2
CONT SPEC 4OZ STRL OR WHT (MISCELLANEOUS) ×1
CONTAINER SPEC 2.5X3XGRAD LEK (MISCELLANEOUS) ×1 IMPLANT
COVER WAND RF STERILE (DRAPES) ×3 IMPLANT
DRAPE PERI LITHO V/GYN (MISCELLANEOUS) ×3 IMPLANT
DRAPE UNDER BUTTOCK W/FLU (DRAPES) ×2 IMPLANT
DRSG TELFA 3X8 NADH (GAUZE/BANDAGES/DRESSINGS) ×3 IMPLANT
ELECT LEEP BALL 5MM 12CM (MISCELLANEOUS) ×3
ELECT REM PT RETURN 9FT ADLT (ELECTROSURGICAL) ×3
ELECTRODE LEEP BALL 5MM 12CM (MISCELLANEOUS) ×1 IMPLANT
ELECTRODE REM PT RTRN 9FT ADLT (ELECTROSURGICAL) ×1 IMPLANT
GAUZE SPONGE 4X4 16PLY XRAY LF (GAUZE/BANDAGES/DRESSINGS) ×2 IMPLANT
GLOVE PI ORTHOPRO 6.5 (GLOVE) ×2
GLOVE PI ORTHOPRO STRL 6.5 (GLOVE) ×1 IMPLANT
GLOVE SURG SYN 6.5 ES PF (GLOVE) ×3 IMPLANT
GLOVE SURG SYN 6.5 PF PI (GLOVE) ×1 IMPLANT
GOWN STRL REUS W/ TWL LRG LVL3 (GOWN DISPOSABLE) ×2 IMPLANT
GOWN STRL REUS W/TWL LRG LVL3 (GOWN DISPOSABLE) ×6
KIT TURNOVER CYSTO (KITS) ×3 IMPLANT
MANIPULATOR UTERINE 4.5 ZUMI (MISCELLANEOUS) ×3 IMPLANT
NDL HPO THNWL 1X22GA REG BVL (NEEDLE) IMPLANT
NDL SAFETY ECLIPSE 18X1.5 (NEEDLE) IMPLANT
NDL SPNL 22GX3.5 QUINCKE BK (NEEDLE) ×1 IMPLANT
NEEDLE HYPO 18GX1.5 SHARP (NEEDLE) ×3
NEEDLE SAFETY 22GX1 (NEEDLE) ×3
NEEDLE SPNL 22GX3.5 QUINCKE BK (NEEDLE) ×3 IMPLANT
NS IRRIG 500ML POUR BTL (IV SOLUTION) ×3 IMPLANT
PACK BASIN MINOR (MISCELLANEOUS) ×3 IMPLANT
PAD DRESSING TELFA 3X8 NADH (GAUZE/BANDAGES/DRESSINGS) ×1 IMPLANT
PAD OB MATERNITY 4.3X12.25 (PERSONAL CARE ITEMS) ×3 IMPLANT
PAD PREP 24X41 OB/GYN DISP (PERSONAL CARE ITEMS) ×3 IMPLANT
SUT SILK 4 0 SH (SUTURE) ×12 IMPLANT
SUT VIC AB 2-0 CT1 27 (SUTURE) ×6
SUT VIC AB 2-0 CT1 TAPERPNT 27 (SUTURE) ×2 IMPLANT
SYR 10ML LL (SYRINGE) ×3 IMPLANT
SYR 3ML LL SCALE MARK (SYRINGE) ×2 IMPLANT
SYR CONTROL 10ML LL (SYRINGE) ×3 IMPLANT

## 2020-01-29 NOTE — Anesthesia Postprocedure Evaluation (Signed)
Anesthesia Post Note  Patient: Natalie Aguirre  Procedure(s) Performed: COLD KNIFE CONIZATION CERVIX WITH BIOPSY (N/A )  Patient location during evaluation: PACU Anesthesia Type: General Level of consciousness: awake and alert Pain management: pain level controlled Vital Signs Assessment: post-procedure vital signs reviewed and stable Respiratory status: spontaneous breathing, nonlabored ventilation, respiratory function stable and patient connected to nasal cannula oxygen Cardiovascular status: blood pressure returned to baseline and stable Postop Assessment: no apparent nausea or vomiting Anesthetic complications: no   No complications documented.   Last Vitals:  Vitals:   01/29/20 1351 01/29/20 1407  BP: 123/84 122/81  Pulse: 68 69  Resp: 15 16  Temp:  36.4 C  SpO2: 99% 100%    Last Pain:  Vitals:   01/29/20 1407  TempSrc: Temporal  PainSc: 0-No pain                 Martha Clan

## 2020-01-29 NOTE — Op Note (Signed)
COLD KNIFE CONIZATION OF CERVIX PROCEDURE NOTE     PROCEDURE DATE: @TODAY @   PATIENT:   Natalie Aguirre 34 y.o. female   Patient:  Natalie Aguirre  34 y.o. female  No LMP recorded. Patient has had an injection. Preoperative diagnosis:  CIN 3 endocervix Postoperative diagnosis:  CIN 3 endocervix  PROCEDURE:  Procedure(s): COLD KNIFE CONIZATION CERVIX WITH BIOPSY (N/A) Surgeon:  Surgeon(s) and Role:    * Demetrias Goodbar, Honor Loh, MD - Primary   Anesthesia:   I/O: Total I/O In: 2050 [P.O.:100; I.V.:1950] Out: 5 [Blood:5]  FINDINGS:  Normal appearing cervix.  Lugols uptake of the entire ectocervix.  IUD strings visible. SPECIMEN:  1. Cone biopsy of cervix, stitch at 12:00 2. Posterior remnant (of cervical cone)   COMPLICATIONS: none apparent   DISPOSITION: vital signs stable to PACU     Indication for Surgery:  Patient presented for colposcopy for ASC-H HPV+ pap.  Biopsy showed CIN 1 at 12:00, but ECC showed CIN 2-3.  She was counseled and scheduled for today's procedure.    Risks, benefits, alternatives, and limitations of procedure explained to patient, including pain, bleeding, infection, failure to remove abnormal tissue and failure to cure dysplasia, need for repeat procedures, damage to pelvic organs, cervical incompetence.  Role of HPV,cervical dysplasia and need for close followup was empasized. Informed written consent was obtained. All questions were answered. Time out performed. Urine pregnancy test was negative.   ??Procedure: The patient was placed in lithotomy position and the bivalved coated speculum was placed in the patient's vagina. A grounding pad placed on the patient. Lugol's solution was applied to the cervix and areas of decreased uptake were noted as above. 14cc 1% lidocaine with pitressin was injected into the cervix circumferentially.  An 11-blade was used to circumscribe the cervix to a depth of 2.1 cm, in a narrow and deep conical shape. This was removed and with stitch  at 12:00. A portion of the posterior cone was decapitated upon removal and this was sent as a separate specimen.  Excellent hemostasis was achieved using roller ball coagulation set at 60 Watts coagulation current. Tthe speculum was removed from the vagina. Specimens were sent to pathology.  All instruments were removed.   Instrument, sponge and needle counts were correct x2.  The patient tolerated the procedure and was brought to PACU in a stable condition.    ?Post-operative instructions given to patient, including instruction to seek medical attention for persistent bright red bleeding, fever, abdominal/pelvic pain, dysuria, nausea or vomiting. She was also told about the possibility of having black tinged discharge for weeks. She was counseled to avoid anything in the vagina (sex/douching/tampons) for 4 weeks. She has a 4 week post-operative check to assess wound healing, review results and discuss further management.    ----- Larey Days, MD Attending Obstetrician and [facility]

## 2020-01-29 NOTE — Anesthesia Preprocedure Evaluation (Signed)
Anesthesia Evaluation  Patient identified by MRN, date of birth, ID band Patient awake    Reviewed: Allergy & Precautions, H&P , NPO status , Patient's Chart, lab work & pertinent test results, reviewed documented beta blocker date and time   History of Anesthesia Complications Negative for: history of anesthetic complications  Airway Mallampati: I  TM Distance: >3 FB Neck ROM: full    Dental  (+) Dental Advidsory Given, Teeth Intact   Pulmonary neg pulmonary ROS,    Pulmonary exam normal breath sounds clear to auscultation       Cardiovascular Exercise Tolerance: Good negative cardio ROS Normal cardiovascular exam Rhythm:regular Rate:Normal     Neuro/Psych negative neurological ROS  negative psych ROS   GI/Hepatic negative GI ROS, Neg liver ROS,   Endo/Other  negative endocrine ROS  Renal/GU negative Renal ROS  negative genitourinary   Musculoskeletal   Abdominal   Peds  Hematology negative hematology ROS (+)   Anesthesia Other Findings Past Medical History: No date: Medical history non-contributory Obesity  Reproductive/Obstetrics negative OB ROS                             Anesthesia Physical Anesthesia Plan  ASA: II  Anesthesia Plan: General   Post-op Pain Management:    Induction: Intravenous  PONV Risk Score and Plan: 3 and Propofol infusion and TIVA  Airway Management Planned: Natural Airway and Simple Face Mask  Additional Equipment:   Intra-op Plan:   Post-operative Plan:   Informed Consent: I have reviewed the patients History and Physical, chart, labs and discussed the procedure including the risks, benefits and alternatives for the proposed anesthesia with the patient or authorized representative who has indicated his/her understanding and acceptance.     Dental Advisory Given  Plan Discussed with: Anesthesiologist, CRNA and Surgeon  Anesthesia Plan  Comments:         Anesthesia Quick Evaluation

## 2020-01-29 NOTE — Discharge Instructions (Signed)

## 2020-01-29 NOTE — Transfer of Care (Signed)
Immediate Anesthesia Transfer of Care Note  Patient: Natalie Aguirre  Procedure(s) Performed: COLD KNIFE CONIZATION CERVIX WITH BIOPSY (N/A )  Patient Location: PACU  Anesthesia Type:General  Level of Consciousness: awake, alert , oriented and patient cooperative  Airway & Oxygen Therapy: Patient Spontanous Breathing and Patient connected to face mask oxygen  Post-op Assessment: Report given to RN and Post -op Vital signs reviewed and stable  Post vital signs: Reviewed and stable  Last Vitals:  Vitals Value Taken Time  BP 111/52 01/29/20 1321  Temp    Pulse 72 01/29/20 1323  Resp 15 01/29/20 1323  SpO2 100 % 01/29/20 1323  Vitals shown include unvalidated device data.  Last Pain:  Vitals:   01/29/20 1032  TempSrc: Oral  PainSc: 0-No pain         Complications: No complications documented.

## 2020-01-29 NOTE — Interval H&P Note (Signed)
History and Physical Interval Note:  01/29/2020 11:46 AM  Natalie Aguirre  has presented today for surgery, with the diagnosis of CIN 3 endocervix.  The various methods of treatment have been discussed with the patient and family. After consideration of risks, benefits and other options for treatment, the patient has consented to  Procedure(s): COLD KNIFE CONIZATION CERVIX WITH BIOPSY (N/A) as a surgical intervention.  The patient's history has been reviewed, patient examined, no change in status, stable for surgery.  I have reviewed the patient's chart and labs.  Questions were answered to the patient's satisfaction.     [place]

## 2020-01-30 ENCOUNTER — Encounter: Payer: Self-pay | Admitting: Obstetrics & Gynecology

## 2020-02-02 LAB — SURGICAL PATHOLOGY

## 2020-09-15 IMAGING — DX DG ANKLE COMPLETE 3+V*L*
3 series · 3 of 3 positions shown · non-contrast
Comparison: None.

CLINICAL DATA: Pain

EXAM:
LEFT ANKLE COMPLETE - 3+ VIEW

[ankle ap]
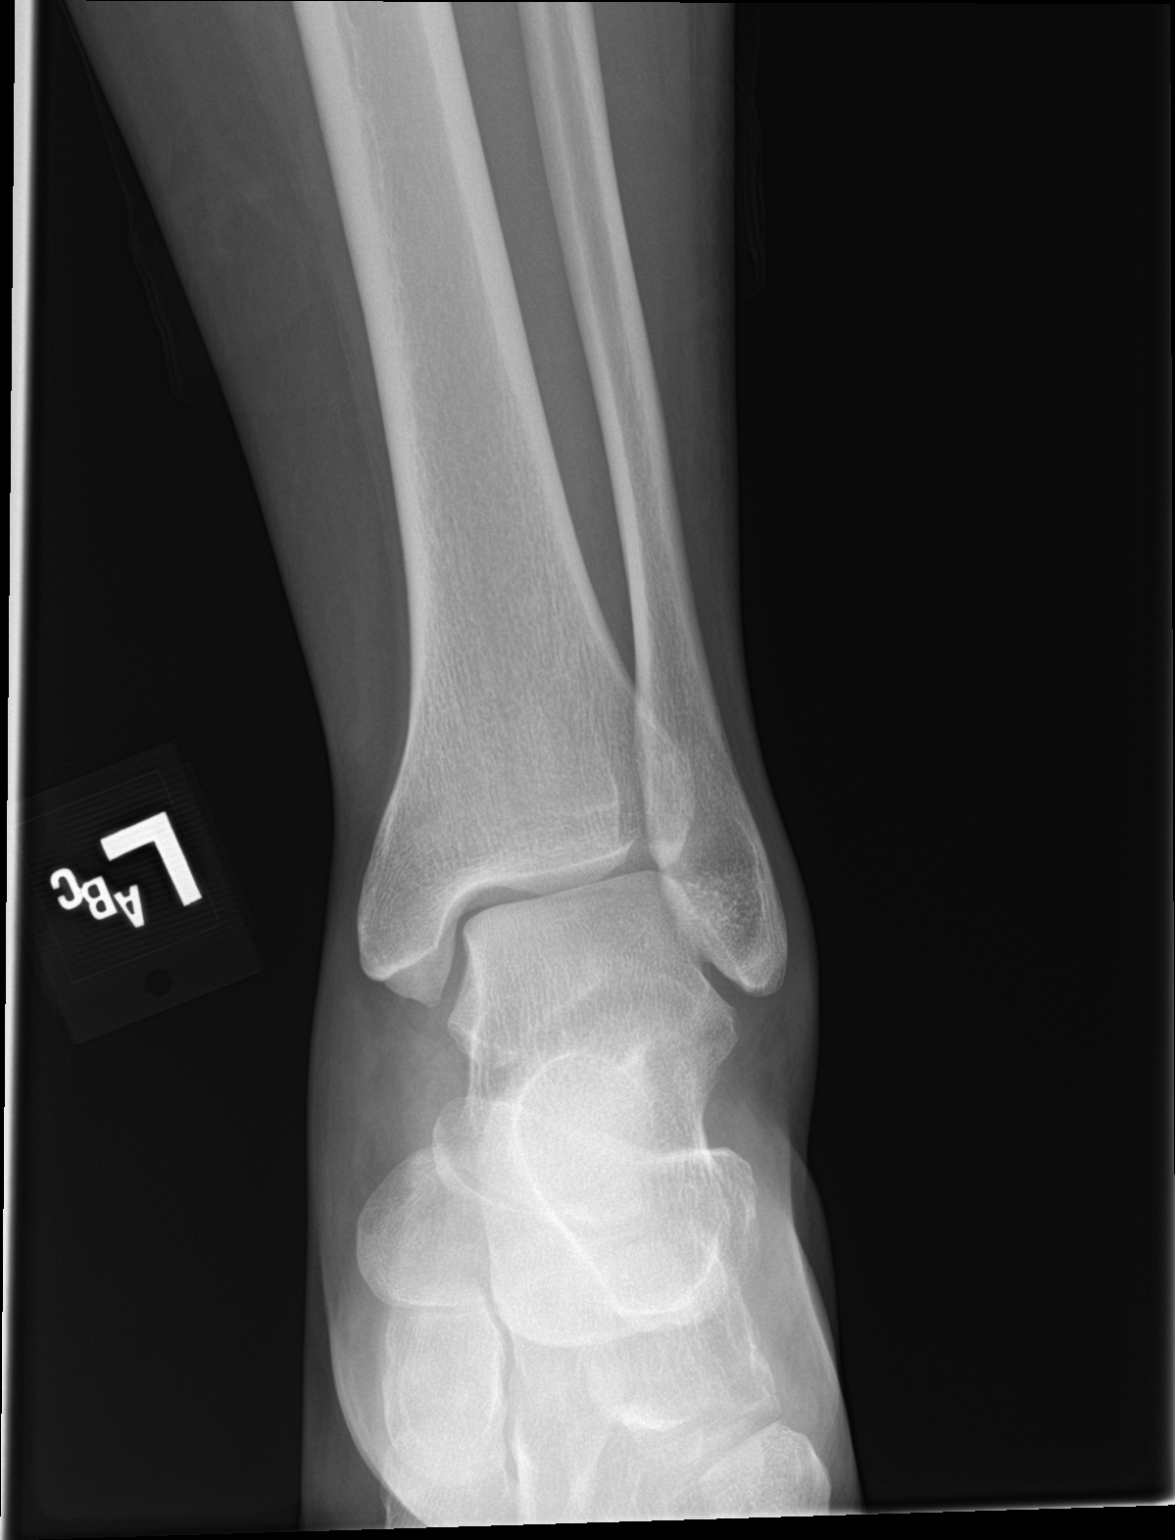

[ankle obl]
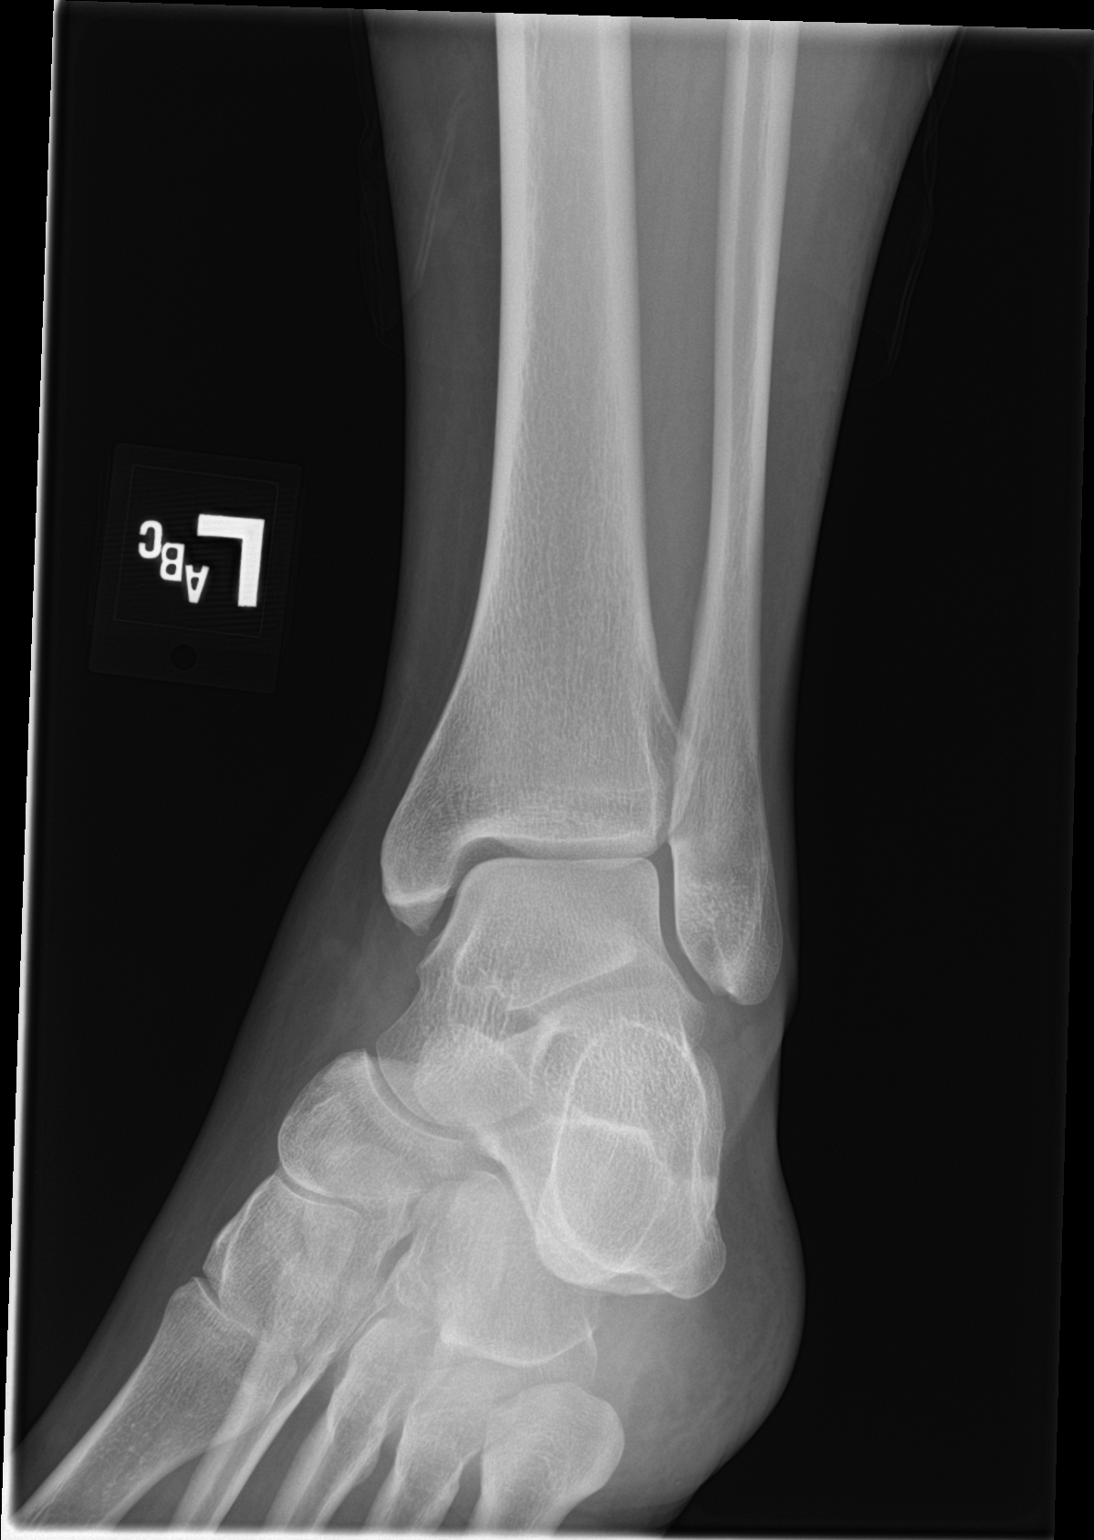

[ankle lat]
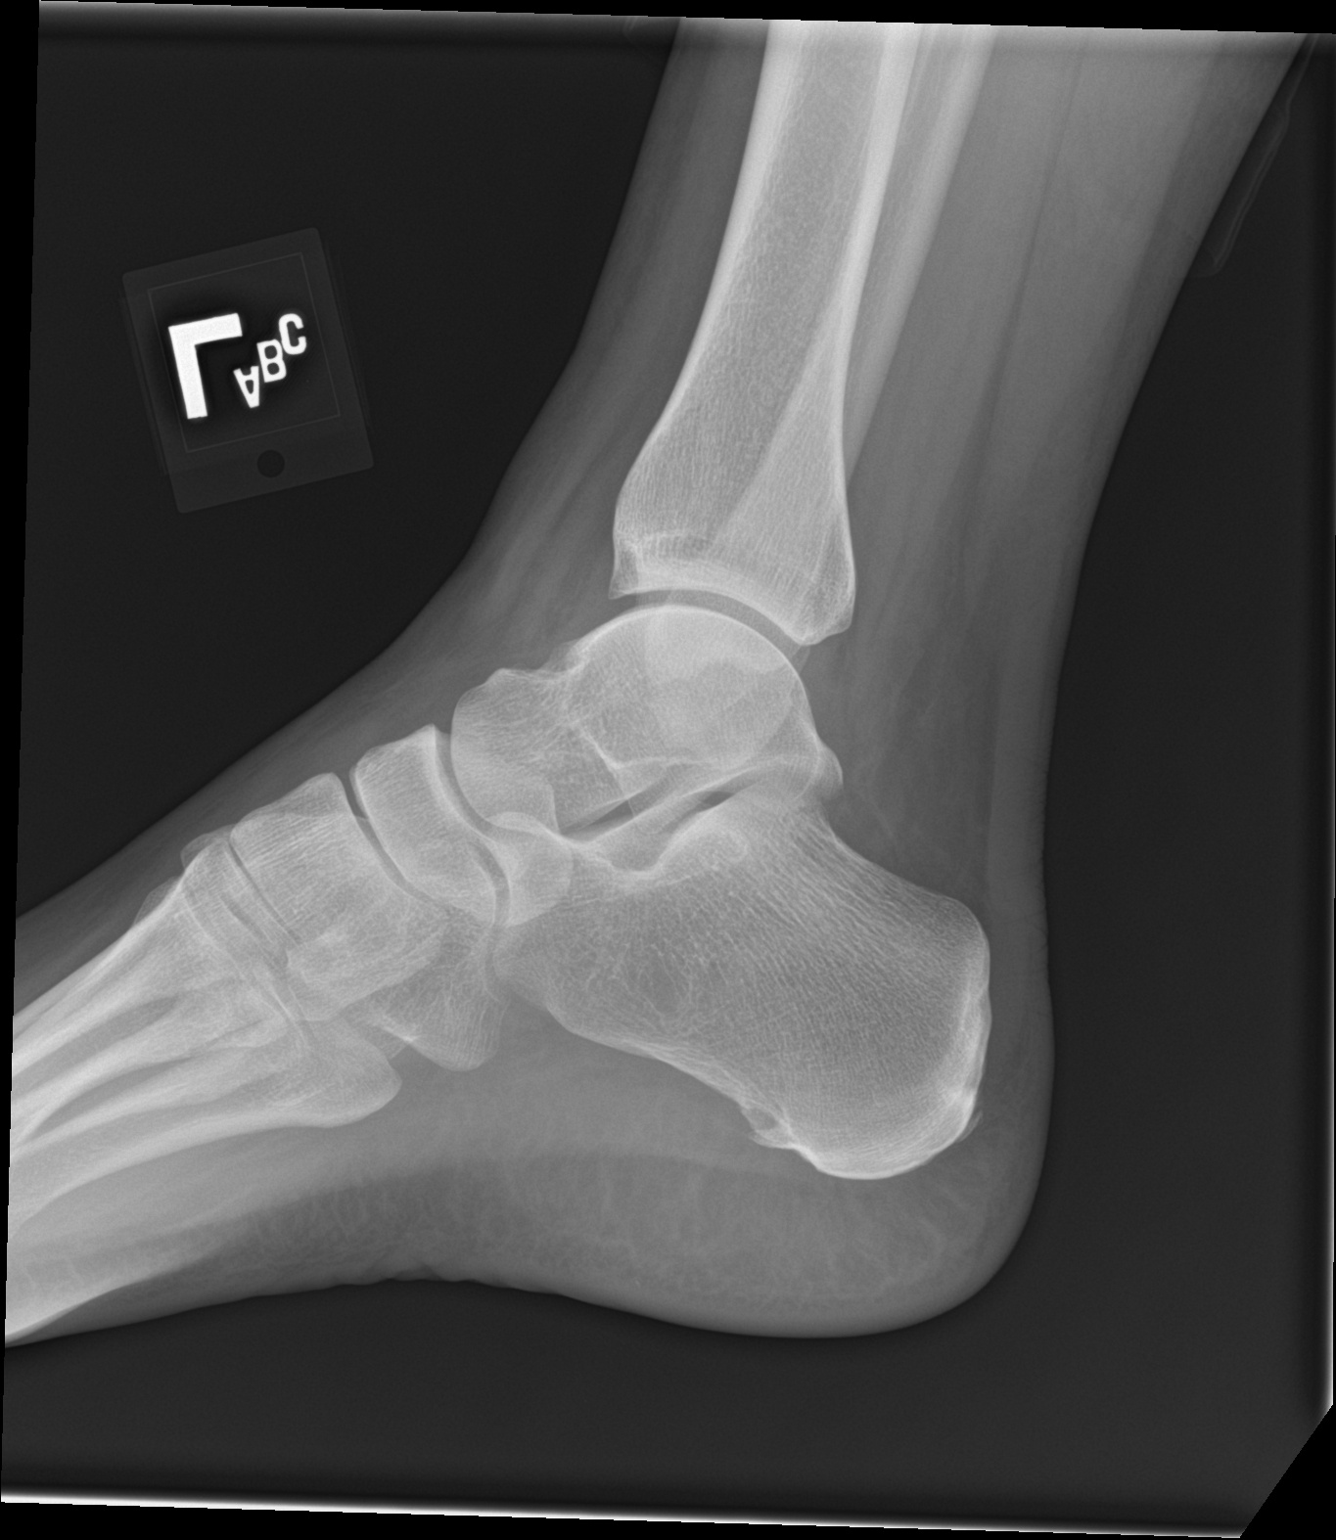

[3 of 3 positions shown; findings below may reference images not displayed]

FINDINGS: There is soft tissue swelling about the ankle without evidence for
an acute displaced fracture or dislocation. The mortise joint is
preserved.
IMPRESSION: Soft tissue swelling without evidence for an acute displaced
fracture or dislocation.

## 2022-02-19 ENCOUNTER — Ambulatory Visit (INDEPENDENT_AMBULATORY_CARE_PROVIDER_SITE_OTHER): Payer: BC Managed Care – PPO | Admitting: Dermatology

## 2022-02-19 ENCOUNTER — Encounter: Payer: Self-pay | Admitting: Dermatology

## 2022-02-19 DIAGNOSIS — Z79899 Other long term (current) drug therapy: Secondary | ICD-10-CM

## 2022-02-19 DIAGNOSIS — D492 Neoplasm of unspecified behavior of bone, soft tissue, and skin: Secondary | ICD-10-CM

## 2022-02-19 DIAGNOSIS — D2261 Melanocytic nevi of right upper limb, including shoulder: Secondary | ICD-10-CM

## 2022-02-19 DIAGNOSIS — L814 Other melanin hyperpigmentation: Secondary | ICD-10-CM

## 2022-02-19 DIAGNOSIS — L918 Other hypertrophic disorders of the skin: Secondary | ICD-10-CM

## 2022-02-19 DIAGNOSIS — D239 Other benign neoplasm of skin, unspecified: Secondary | ICD-10-CM

## 2022-02-19 DIAGNOSIS — D229 Melanocytic nevi, unspecified: Secondary | ICD-10-CM

## 2022-02-19 DIAGNOSIS — Z1283 Encounter for screening for malignant neoplasm of skin: Secondary | ICD-10-CM | POA: Diagnosis not present

## 2022-02-19 DIAGNOSIS — L719 Rosacea, unspecified: Secondary | ICD-10-CM | POA: Diagnosis not present

## 2022-02-19 DIAGNOSIS — L578 Other skin changes due to chronic exposure to nonionizing radiation: Secondary | ICD-10-CM | POA: Diagnosis not present

## 2022-02-19 DIAGNOSIS — D225 Melanocytic nevi of trunk: Secondary | ICD-10-CM | POA: Diagnosis not present

## 2022-02-19 DIAGNOSIS — L821 Other seborrheic keratosis: Secondary | ICD-10-CM

## 2022-02-19 HISTORY — DX: Other benign neoplasm of skin, unspecified: D23.9

## 2022-02-19 NOTE — Progress Notes (Signed)
New Patient Visit  Subjective  Natalie Aguirre is a 36 y.o. female who presents for the following: Annual Exam (No personal Hx of skin cancer or dysplastic nevi). The patient presents for Total-Body Skin Exam (TBSE) for skin cancer screening and mole check.  The patient has spots, moles and lesions to be evaluated, some may be new or changing and the patient has concerns that these could be cancer.  Objective  Well appearing patient in no apparent distress; mood and affect are within normal limits.  A full examination was performed including scalp, head, eyes, ears, nose, lips, neck, chest, axillae, abdomen, back, buttocks, bilateral upper extremities, bilateral lower extremities, hands, feet, fingers, toes, fingernails, and toenails. All findings within normal limits unless otherwise noted below.  Right Breast 0.6cm x 0.3cm irregular brown macule  Right Inferior scapula 1.1cm irregular brown macule  Right lateral tricep 0.6cm brown macule  Right Mid Back Paraspinal 0.7cm brown irregular macule  face Mid face erythema with telangiectasias   Assessment & Plan   Lentigines - Scattered tan macules - Due to sun exposure - Benign-appearing, observe - Recommend daily broad spectrum sunscreen SPF 30+ to sun-exposed areas, reapply every 2 hours as needed. - Call for any changes  Seborrheic Keratoses - Stuck-on, waxy, tan-brown papules and/or plaques  - Benign-appearing - Discussed benign etiology and prognosis. - Observe - Call for any changes  Melanocytic Nevi - Tan-brown and/or pink-flesh-colored symmetric macules and papules - Benign appearing on exam today - Observation - Call clinic for new or changing moles - Recommend daily use of broad spectrum spf 30+ sunscreen to sun-exposed areas.   Hemangiomas - Red papules - Discussed benign nature - Observe - Call for any changes  Actinic Damage - Chronic condition, secondary to cumulative UV/sun exposure - diffuse  scaly erythematous macules with underlying dyspigmentation - Recommend daily broad spectrum sunscreen SPF 30+ to sun-exposed areas, reapply every 2 hours as needed.  - Staying in the shade or wearing long sleeves, sun glasses (UVA+UVB protection) and wide brim hats (4-inch brim around the entire circumference of the hat) are also recommended for sun protection.  - Call for new or changing lesions.  Skin cancer screening performed today.  Acrochordons (Skin Tags) - Fleshy, skin-colored pedunculated papules - Benign appearing.  - Observe. - If desired, they can be removed with an in office procedure that is not covered by insurance. - Please call the clinic if you notice any new or changing lesions.   Neoplasm of skin (4) Right Breast Epidermal / dermal shaving  Lesion diameter (cm):  0.6 Informed consent: discussed and consent obtained   Timeout: patient name, date of birth, surgical site, and procedure verified   Procedure prep:  Patient was prepped and draped in usual sterile fashion Prep type:  Isopropyl alcohol Anesthesia: the lesion was anesthetized in a standard fashion   Anesthetic:  1% lidocaine w/ epinephrine 1-100,000 buffered w/ 8.4% NaHCO3 Instrument used: flexible razor blade   Hemostasis achieved with: pressure, aluminum chloride and electrodesiccation   Outcome: patient tolerated procedure well   Post-procedure details: sterile dressing applied and wound care instructions given   Dressing type: bandage and petrolatum    Specimen 1 - Surgical pathology Differential Diagnosis: R/O atypia Check Margins: No  Right Inferior scapula Epidermal / dermal shaving  Lesion diameter (cm):  1.1 Informed consent: discussed and consent obtained   Timeout: patient name, date of birth, surgical site, and procedure verified   Procedure prep:  Patient was  prepped and draped in usual sterile fashion Prep type:  Isopropyl alcohol Anesthesia: the lesion was anesthetized in a  standard fashion   Anesthetic:  1% lidocaine w/ epinephrine 1-100,000 buffered w/ 8.4% NaHCO3 Instrument used: flexible razor blade   Hemostasis achieved with: pressure, aluminum chloride and electrodesiccation   Outcome: patient tolerated procedure well   Post-procedure details: sterile dressing applied and wound care instructions given   Dressing type: bandage and petrolatum    Specimen 2 - Surgical pathology Differential Diagnosis: R/O atypia Check Margins: No  Right lateral tricep Epidermal / dermal shaving  Lesion diameter (cm):  0.6 Informed consent: discussed and consent obtained   Timeout: patient name, date of birth, surgical site, and procedure verified   Procedure prep:  Patient was prepped and draped in usual sterile fashion Prep type:  Isopropyl alcohol Anesthesia: the lesion was anesthetized in a standard fashion   Anesthetic:  1% lidocaine w/ epinephrine 1-100,000 buffered w/ 8.4% NaHCO3 Instrument used: flexible razor blade   Hemostasis achieved with: pressure, aluminum chloride and electrodesiccation   Outcome: patient tolerated procedure well   Post-procedure details: sterile dressing applied and wound care instructions given   Dressing type: bandage and petrolatum    Specimen 3 - Surgical pathology Differential Diagnosis: R/O atypia Check Margins: No  Right Mid Back Paraspinal Epidermal / dermal shaving  Lesion diameter (cm):  0.7 Informed consent: discussed and consent obtained   Timeout: patient name, date of birth, surgical site, and procedure verified   Procedure prep:  Patient was prepped and draped in usual sterile fashion Prep type:  Isopropyl alcohol Anesthesia: the lesion was anesthetized in a standard fashion   Anesthetic:  1% lidocaine w/ epinephrine 1-100,000 buffered w/ 8.4% NaHCO3 Instrument used: flexible razor blade   Hemostasis achieved with: pressure, aluminum chloride and electrodesiccation   Outcome: patient tolerated procedure well    Post-procedure details: sterile dressing applied and wound care instructions given   Dressing type: bandage and petrolatum    Specimen 4 - Surgical pathology Differential Diagnosis: R/O atypia Check Margins: No  Rosacea face Rosacea is a chronic progressive skin condition usually affecting the face of adults, causing redness and/or acne bumps. It is treatable but not curable. It sometimes affects the eyes (ocular rosacea) as well. It may respond to topical and/or systemic medication and can flare with stress, sun exposure, alcohol, exercise, topical steroids (including hydrocortisone/cortisone 10) and some foods.  Daily application of broad spectrum spf 30+ sunscreen to face is recommended to reduce flares.  Start [facility] Oxymetazoline: 1%, Ivermectin: 1%, Niacinamide: 2% cream twice daily to face.   Return in about 3 months (around 05/22/2022) for Rosacea Follow Up.  I, Emelia Salisbury, CMA, am acting as scribe for Sarina Ser, MD. Documentation: I have reviewed the above documentation for accuracy and completeness, and I agree with the above.  Sarina Ser, MD

## 2022-02-19 NOTE — Patient Instructions (Addendum)
Wound Care Instructions  Cleanse wound gently with soap and water once a day then pat dry with clean gauze. Apply a thin coat of Petrolatum (petroleum jelly, "Vaseline") over the wound (unless you have an allergy to this). We recommend that you use a new, sterile tube of Vaseline. Do not pick or remove scabs. Do not remove the yellow or white "healing tissue" from the base of the wound.  Cover the wound with fresh, clean, nonstick gauze and secure with paper tape. You may use Band-Aids in place of gauze and tape if the wound is small enough, but would recommend trimming much of the tape off as there is often too much. Sometimes Band-Aids can irritate the skin.  You should call the office for your biopsy report after 1 week if you have not already been contacted.  If you experience any problems, such as abnormal amounts of bleeding, swelling, significant bruising, significant pain, or evidence of infection, please call the office immediately.  FOR ADULT SURGERY PATIENTS: If you need something for pain relief you may take 1 extra strength Tylenol (acetaminophen) AND 2 Ibuprofen ('200mg'$  each) together every 4 hours as needed for pain. (do not take these if you are allergic to them or if you have a reason you should not take them.) Typically, you may only need pain medication for 1 to 3 days.    Will prescribe [facility] Rosacea cream twice daily as needed to affected areas on the face. The patient was advised this is not covered by insurance since it is made by a compounding pharmacy. They will receive an email to check out and the medication will be mailed to their home.     Instructions for [facility] Medications  One or more of your medications was sent to the [facility] mail order compounding pharmacy. You will receive an email from them and can purchase the medicine through that link. It will then be mailed to your home at the address you confirmed. If for any reason you do not  receive an email from them, please check your spam folder. If you still do not find the email, please let us know. [facility] phone number is [phone].     Recommend daily broad spectrum sunscreen SPF 30+ to sun-exposed areas, reapply every 2 hours as needed. Call for new or changing lesions.  Staying in the shade or wearing long sleeves, sun glasses (UVA+UVB protection) and wide brim hats (4-inch brim around the entire circumference of the hat) are also recommended for sun protection.    Melanoma ABCDEs  Melanoma is the most dangerous type of skin cancer, and is the leading cause of death from skin disease.  You are more likely to develop melanoma if you: Have light-colored skin, light-colored eyes, or red or blond hair Spend a lot of time in the sun Tan regularly, either outdoors or in a tanning bed Have had blistering sunburns, especially during childhood Have a close family member who has had a melanoma Have atypical moles or large birthmarks  Early detection of melanoma is key since treatment is typically straightforward and cure rates are extremely high if we catch it early.   The first sign of melanoma is often a change in a mole or a new dark spot.  The ABCDE system is a way of remembering the signs of melanoma.  A for asymmetry:  The two halves do not match. B for border:  The edges of the growth are irregular. C for  color:  A mixture of colors are present instead of an even brown color. D for diameter:  Melanomas are usually (but not always) greater than 53m - the size of a pencil eraser. E for evolution:  The spot keeps changing in size, shape, and color.  Please check your skin once per month between visits. You can use a small mirror in front and a large mirror behind you to keep an eye on the back side or your body.   If you see any new or changing lesions before your next follow-up, please call to schedule a visit.  Please continue daily skin protection  including broad spectrum sunscreen SPF 30+ to sun-exposed areas, reapplying every 2 hours as needed when you're outdoors.   Staying in the shade or wearing long sleeves, sun glasses (UVA+UVB protection) and wide brim hats (4-inch brim around the entire circumference of the hat) are also recommended for sun protection.     Due to recent changes in healthcare laws, you may see results of your pathology and/or laboratory studies on MyChart before the doctors have had a chance to review them. We understand that in some cases there may be results that are confusing or concerning to you. Please understand that not all results are received at the same time and often the doctors may need to interpret multiple results in order to provide you with the best plan of care or course of treatment. Therefore, we ask that you please give u[place]2 business days to thoroughly review all your results before contacting the office for clarification. Should we see a critical lab result, you will be contacted sooner.   If You Need Anything After Your Visit  If you have any questions or concerns for your doctor, please call our main line at [phone] press option 4 to reach your doctor's medical assistant. If no one answers, please leave a voicemail as directed and we will return your call as soon as possible. Messages left after 4 pm will be answered the following business day.   You may also send [place] message via [place] We typically respond to MyChart messages within 1-2 business days.  For prescription refills, please ask your pharmacy to contact our office. Our fax number is [phone]  If you have an urgent issue when the clinic is closed that cannot wait until the next business day, you can page your doctor at the number below.    Please note that while we do our best to be available for urgent issues outside of office hours, we are not available 24/7.   If you have an urgent issue and are unable to reach  u[place] you may choose to seek medical care at your doctor's office, retail clinic, urgent care center, or emergency room.  If you have a medical emergency, please immediately call 911 or go to the emergency department.  Pager Numbers  - Dr. KNehemiah Massed [phone] - Dr. MLaurence Ferrari [phone] - Dr. SNicole Kindred [phone] In the event of inclement weather, please call our main line at [phone] an update on the status of any delays or closures.  Dermatology Medication Tips: Please keep the boxes that topical medications come in in order to help keep track of the instructions about where and how to use these. Pharmacies typically print the medication instructions only on the boxes and not directly on the medication tubes.   If your medication is too expensive, please contact our office at [phone] 4  or send [place] a message through [place].   We are unable to tell what your co-pay for medications will be in advance as this is different depending on your insurance coverage. However, we may be able to find a substitute medication at lower cost or fill out paperwork to get insurance to cover a needed medication.   If a prior authorization is required to get your medication covered by your insurance company, please allow [place] 1-2 business days to complete this process.  Drug prices often vary depending on where the prescription is filled and some pharmacies may offer cheaper prices.  The website www.goodrx.com contains coupons for medications through different pharmacies. The prices here do not account for what the cost may be with help from insurance (it may be cheaper with your insurance), but the website can give you the price if you did not use any insurance.  - You can print the associated coupon and take it with your prescription to the pharmacy.  - You may also stop by our office during regular business hours and pick up a GoodRx coupon card.  - If you need your prescription sent  electronically to a different pharmacy, notify our office through [facility] or by phone at [phone] option 4.     Si Usted Necesita Algo Despus de Su Visita  Tambin puede enviarnos un mensaje a travs de Pharmacist, community. Por lo general respondemos a los mensajes de MyChart en el transcurso de 1 a 2 das hbiles.  Para renovar recetas, por favor pida a su farmacia que se ponga en contacto con nuestra oficina. Harland Dingwall de fax es [place] [phone].  Si tiene un asunto urgente cuando la clnica est cerrada y que no puede esperar hasta el siguiente da hbil, puede llamar/localizar a su doctor(a) al nmero que aparece a continuacin.   Por favor, tenga en cuenta que aunque hacemos todo lo posible para estar disponibles para asuntos urgentes fuera del horario de [place], no estamos disponibles las 24 horas del da, los 7 das de la [place].   Si tiene un problema urgente y no puede comunicarse con nosotros, puede optar por buscar atencin mdica  en el consultorio de su doctor(a), en una clnica privada, en un centro de atencin urgente o en una sala de emergencias.  Si tiene Engineering geologist, por favor llame inmediatamente al 911 o vaya a la sala de emergencias.  Nmeros de bper  - Dr. Nehemiah Massed: [phone]  - Dra. Moye: [phone]  - Dra. Nicole Kindred: [phone]  En caso de inclemencias del [place], por favor llame a Johnsie Kindred principal al [phone] para una actualizacin sobre el [place] de cualquier retraso o cierre.  Consejos para la medicacin en dermatologa: Por favor, guarde las cajas en las que vienen los medicamentos de uso tpico para ayudarle a seguir las instrucciones sobre dnde y cmo usarlos. Las farmacias generalmente imprimen las instrucciones del medicamento slo en las cajas y no directamente en los tubos del [place].   Si su medicamento es muy caro, por favor, pngase en contacto con Zigmund Daniel llamando al [phone] y presione la  opcin 4 o envenos un mensaje a travs de Pharmacist, community.   No podemos decirle cul ser su copago por los medicamentos por adelantado ya que esto es diferente dependiendo de la cobertura de su seguro. Sin embargo, es posible que podamos encontrar un medicamento sustituto a Electrical engineer un formulario para que el seguro cubra el medicamento que se considera necesario.  Si se requiere una autorizacin previa para que su compaa de seguros [place] su medicamento, por favor permtanos de 1 a 2 das hbiles para completar este proceso.  Los precios de los medicamentos varan con frecuencia dependiendo del Environmental consultant de dnde se surte la receta y alguna farmacias pueden ofrecer precios ms baratos.  El sitio web www.goodrx.com tiene cupones para medicamentos de Airline pilot. Los precios aqu no tienen en cuenta lo que podra costar con la ayuda del seguro (puede ser ms barato con su seguro), pero el sitio web puede darle el precio si no utiliz Research scientist (physical sciences).  - Puede imprimir el cupn correspondiente y llevarlo con su receta a la farmacia.  - Tambin puede pasar por nuestra oficina durante el horario de atencin regular y Charity fundraiser una tarjeta de cupones de GoodRx.  - Si necesita que su receta se enve electrnicamente a una farmacia diferente, informe a nuestra oficina a travs de MyChart de [place] o por telfono llamando al [phone] y presione la opcin 4.

## 2022-02-28 ENCOUNTER — Telehealth: Payer: Self-pay

## 2022-02-28 NOTE — Telephone Encounter (Signed)
-----   Message from Ralene Bathe, MD sent at 02/28/2022 12:56 PM EST ----- Diagnosis 1. 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 Skin , right inferior scapula DYSPLASTIC COMPOUND NEVUS WITH SEVERE ATYPIA, PERIPHERAL AND DEEP MARGINS INVOLVED 3. Skin , right lateral tricep DYSPLASTIC COMPOUND NEVUS WITH MODERATE ATYPIA PERIPHERAL MARGIN INVOLVED 4. Skin , right mid back paraspinal DYSPLASTIC COMPOUND NEVUS WITH MILD ATYPIA WITH FOCAL SCAR, DEEP MARGIN INVOLVED, SEE DESCRIPTION  1&3 - Moderate dysplastic Recheck next visit 2- severe dysplastic Schedule surgery 4- mild dysplastic Recheck next visit

## 2022-02-28 NOTE — Telephone Encounter (Signed)
Patient advised of BX results and surgery has been scheduled. aw

## 2022-03-07 ENCOUNTER — Encounter: Payer: Self-pay | Admitting: Dermatology

## 2022-03-20 ENCOUNTER — Ambulatory Visit (INDEPENDENT_AMBULATORY_CARE_PROVIDER_SITE_OTHER): Payer: BC Managed Care – PPO | Admitting: Dermatology

## 2022-03-20 DIAGNOSIS — D225 Melanocytic nevi of trunk: Secondary | ICD-10-CM

## 2022-03-20 DIAGNOSIS — D485 Neoplasm of uncertain behavior of skin: Secondary | ICD-10-CM

## 2022-03-20 MED ORDER — MUPIROCIN 2 % EX OINT: 1.0000 | TOPICAL_OINTMENT | Freq: Every day | CUTANEOUS | 0 refills | Status: DC

## 2022-03-20 NOTE — Patient Instructions (Signed)

## 2022-03-20 NOTE — Progress Notes (Signed)
   Follow-Up Visit   Subjective  Natalie Aguirre is a 36 y.o. female who presents for the following: Procedure (Biopsy proven severe dysplastic nevus of right inf scapula - Excise today).  The following portions of the chart were reviewed this encounter and updated as appropriate:   Tobacco  Allergies  Meds  Problems  Med Hx  Surg Hx  Fam Hx     Review of Systems:  No other skin or systemic complaints except as noted in HPI or Assessment and Plan.  Objective  Well appearing patient in no apparent distress; mood and affect are within normal limits.  A focused examination was performed including back. Relevant physical exam findings are noted in the Assessment and Plan.  Right inf scapula Healing biopsy site   Assessment & Plan  Neoplasm of uncertain behavior of skin Right inf scapula  Skin excision  Lesion length (cm):  1.5 Lesion width (cm):  0.8 Margin per side (cm):  0.2 Total excision diameter (cm):  1.9 Informed consent: discussed and consent obtained   Timeout: patient name, date of birth, surgical site, and procedure verified   Procedure prep:  Patient was prepped and draped in usual sterile fashion Prep type:  Isopropyl alcohol and povidone-iodine Anesthesia: the lesion was anesthetized in a standard fashion   Anesthetic:  1% lidocaine w/ epinephrine 1-100,000 buffered w/ 8.4% NaHCO3 Instrument used: #15 blade   Hemostasis achieved with: pressure   Hemostasis achieved with comment:  Electrocautery Outcome: patient tolerated procedure well with no complications   Post-procedure details: sterile dressing applied and wound care instructions given   Dressing type: bandage and pressure dressing (mupirocin)    Skin repair Complexity:  Complex Final length (cm):  3 Reason for type of repair: reduce tension to allow closure, reduce the risk of dehiscence, infection, and necrosis, reduce subcutaneous dead space and avoid a hematoma, allow closure of the large defect,  preserve normal anatomy, preserve normal anatomical and functional relationships and enhance both functionality and cosmetic results   Undermining: area extensively undermined   Undermining comment:  Undermining defect 1.2 cm Subcutaneous layers (deep stitches):  Suture size:  2-0 Suture type: Vicryl (polyglactin 910)   Subcutaneous suture technique: inverted dermal. Fine/surface layer approximation (top stitches):  Suture size:  2-0 Suture type: nylon   Stitches: simple running   Suture removal (days):  7 Hemostasis achieved with: suture and pressure Outcome: patient tolerated procedure well with no complications   Post-procedure details: sterile dressing applied and wound care instructions given   Dressing type: bandage and pressure dressing (mupirocin)    mupirocin ointment (BACTROBAN) 2 % Apply 1 Application topically daily. With dressing changes  Specimen 1 - Surgical pathology Differential Diagnosis: Biopsy proven severe dysplastic nevus Check Margins: Yes [id number]  History of Dysplastic Nevi - Right breast, right lat tricep, right mid back paraspinal - No evidence of recurrence today - Recommend regular full body skin exams - Recommend daily broad spectrum sunscreen SPF 30+ to sun-exposed areas, reapply every 2 hours as needed.  - Call if any new or changing lesions are noted between office visits  Return in about 6 months (around 09/19/2022) for TBSE.  I, Ashok Cordia, CMA, am acting as scribe for Sarina Ser, MD . Documentation: I have reviewed the above documentation for accuracy and completeness, and I agree with the above.  Sarina Ser, MD

## 2022-03-21 ENCOUNTER — Telehealth: Payer: Self-pay

## 2022-03-21 NOTE — Telephone Encounter (Signed)
Spoke with patient regarding surgery. She is doing fine/hd 

## 2022-03-29 ENCOUNTER — Telehealth: Payer: Self-pay

## 2022-03-29 NOTE — Telephone Encounter (Signed)
-----   Message from Ralene Bathe, MD sent at 03/29/2022 11:37 AM EST ----- Diagnosis Skin (M), right inf scapula EXCISION, PERSISTENT DYSPLASTIC NEVUS, MARGINS FREE  Sited of Severe Dysplastic Margins free

## 2022-03-29 NOTE — Telephone Encounter (Signed)
Called patient. N/A. LMOVM for her to C/B for pathology results and to see if she needs suture removal.

## 2022-03-30 ENCOUNTER — Encounter: Payer: Self-pay | Admitting: Dermatology

## 2022-05-01 ENCOUNTER — Telehealth: Payer: Self-pay

## 2022-05-01 NOTE — Telephone Encounter (Signed)
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 Pt states that surgical site is healing well and she has no concerns at this time. Pt saw her pathology results on MyChart and had no further questions or concerns.

## 2022-05-01 NOTE — Telephone Encounter (Signed)
-----  Message from Ralene Bathe, MD sent at 03/29/2022 11:37 AM EST ----- Diagnosis Skin (M), right inf scapula EXCISION, PERSISTENT DYSPLASTIC NEVUS, MARGINS FREE  Sited of Severe Dysplastic Margins free

## 2022-05-21 ENCOUNTER — Ambulatory Visit
Admission: EM | Admit: 2022-05-21 | Discharge: 2022-05-21 | Disposition: A | Discharge status: Home / Self Care | Payer: BC Managed Care – PPO

## 2022-05-21 DIAGNOSIS — M542 Cervicalgia: Secondary | ICD-10-CM | POA: Diagnosis not present
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
## 2022-05-21 NOTE — Discharge Instructions (Addendum)
Take ibuprofen or Tylenol as needed for discomfort.    Go to the emergency department if you have acute neck pain or other concerning symptoms.    Follow up with your primary care provider if your symptoms are not improving.
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
## 2022-05-22 ENCOUNTER — Ambulatory Visit: Payer: BC Managed Care – PPO | Admitting: Dermatology

## 2022-09-12 ENCOUNTER — Ambulatory Visit: Payer: BC Managed Care – PPO | Admitting: Dermatology

## 2023-01-15 ENCOUNTER — Ambulatory Visit (INDEPENDENT_AMBULATORY_CARE_PROVIDER_SITE_OTHER): Payer: BC Managed Care – PPO | Admitting: Dermatology

## 2023-01-15 DIAGNOSIS — L821 Other seborrheic keratosis: Secondary | ICD-10-CM

## 2023-01-15 DIAGNOSIS — Z86018 Personal history of other benign neoplasm: Secondary | ICD-10-CM

## 2023-01-15 DIAGNOSIS — D1801 Hemangioma of skin and subcutaneous tissue: Secondary | ICD-10-CM

## 2023-01-15 DIAGNOSIS — L814 Other melanin hyperpigmentation: Secondary | ICD-10-CM | POA: Diagnosis not present

## 2023-01-15 DIAGNOSIS — L578 Other skin changes due to chronic exposure to nonionizing radiation: Secondary | ICD-10-CM | POA: Diagnosis not present

## 2023-01-15 DIAGNOSIS — W908XXA Exposure to other nonionizing radiation, initial encounter: Secondary | ICD-10-CM | POA: Diagnosis not present

## 2023-01-15 DIAGNOSIS — L719 Rosacea, unspecified: Secondary | ICD-10-CM

## 2023-01-15 DIAGNOSIS — Z7189 Other specified counseling: Secondary | ICD-10-CM

## 2023-01-15 DIAGNOSIS — Z1283 Encounter for screening for malignant neoplasm of skin: Secondary | ICD-10-CM

## 2023-01-15 DIAGNOSIS — D229 Melanocytic nevi, unspecified: Secondary | ICD-10-CM

## 2023-01-15 NOTE — Patient Instructions (Addendum)
Rosacea  What is rosacea? Rosacea (say: ro-zay-sha) is a common skin disease that usually begins as a trend of flushing or blushing easily.  As rosacea progresses, a persistent redness in the center of the face will develop and may gradually spread beyond the nose and cheeks to the forehead and chin.  In some cases, the ears, chest, and back could be affected.  Rosacea may appear as tiny blood vessels or small red bumps that occur in crops.  Frequently they can contain pus, and are called "pustules".  If the bumps do not contain pus, they are referred to as "papules".  Rarely, in prolonged, untreated cases of rosacea, the oil glands of the nose and cheeks may become permanently enlarged.  This is called rhinophyma, and is seen more frequently in men.  Signs and Risks In its beginning stages, rosacea tends to come and go, which makes it difficult to recognize.  It can start as intermittent flushing of the face.  Eventually, blood vessels may become permanently visible.  Pustules and papules can appear, but can be mistaken for adult acne.  People of all races, ages, genders and ethnic groups are at risk of developing rosacea.  However, it is more common in women (especially around menopause) and adults with fair skin between the ages of 34 and 61.  Treatment Dermatologists typically recommend a combination of treatments to effectively manage rosacea.  Treatment can improve symptoms and may stop the progression of the rosacea.  Treatment may involve both topical and oral medications.  The tetracycline antibiotics are often used for their anti-inflammatory effect; however, because of the possibility of developing antibiotic resistance, they should not be used long term at full dose.  For dilated blood vessels the options include electrodessication (uses electric current through a small needle), laser treatment, and cosmetics to hide the redness.   With all forms of treatment, improvement is a slow process, and  patients may not see any results for the first 3-4 weeks.  It is very important to avoid the sun and other triggers.  Patients must wear sunscreen daily.  Skin Care Instructions: Cleanse the skin with a mild soap such as CeraVe cleanser, Cetaphil cleanser, or Dove soap once or twice daily as needed. Moisturize with Eucerin Redness Relief Daily Perfecting Lotion (has a subtle green tint), CeraVe Moisturizing Cream, or Oil of Olay Daily Moisturizer with sunscreen every morning and/or night as recommended. Makeup should be "non-comedogenic" (won't clog pores) and be labeled "for sensitive skin". Good choices for cosmetics are: Neutrogena, Almay, and Physician's Formula.  Any product with a green tint tends to offset a red complexion. If your eyes are dry and irritated, use artificial tears 2-3 times per day and cleanse the eyelids daily with baby shampoo.  Have your eyes examined at least every 2 years.  Be sure to tell your eye doctor that you have rosacea. Alcoholic beverages tend to cause flushing of the skin, and may make rosacea worse. Always wear sunscreen, protect your skin from extreme hot and cold temperatures, and avoid spicy foods, hot drinks, and mechanical irritation such as rubbing, scrubbing, or massaging the face.  Avoid harsh skin cleansers, cleansing masks, astringents, and exfoliation. If a particular product burns or makes your face feel tight, then it is likely to flare your rosacea. If you are having difficulty finding a sunscreen that you can tolerate, you may try switching to a chemical-free sunscreen.  These are ones whose active ingredient is zinc oxide or titanium dioxide  only.  They should also be fragrance free, non-comedogenic, and labeled for sensitive skin. Rosacea triggers may vary from person to person.  There are a variety of foods that have been reported to trigger rosacea.  Some patients find that keeping a diary of what they were doing when they flared helps them avoid  triggers.   Melanoma ABCDEs  Melanoma is the most dangerous type of skin cancer, and is the leading cause of death from skin disease.  You are more likely to develop melanoma if you: Have light-colored skin, light-colored eyes, or red or blond hair Spend a lot of time in the sun Tan regularly, either outdoors or in a tanning bed Have had blistering sunburns, especially during childhood Have a close family member who has had a melanoma Have atypical moles or large birthmarks  Early detection of melanoma is key since treatment is typically straightforward and cure rates are extremely high if we catch it early.   The first sign of melanoma is often a change in a mole or a new dark spot.  The ABCDE system is a way of remembering the signs of melanoma.  A for asymmetry:  The two halves do not match. B for border:  The edges of the growth are irregular. C for color:  A mixture of colors are present instead of an even brown color. D for diameter:  Melanomas are usually (but not always) greater than 6mm - the size of a pencil eraser. E for evolution:  The spot keeps changing in size, shape, and color.  Please check your skin once per month between visits. You can use a small mirror in front and a large mirror behind you to keep an eye on the back side or your body.   If you see any new or changing lesions before your next follow-up, please call to schedule a visit.  Please continue daily skin protection including broad spectrum sunscreen SPF 30+ to sun-exposed areas, reapplying every 2 hours as needed when you're outdoors.   Staying in the shade or wearing long sleeves, sun glasses (UVA+UVB protection) and wide brim hats (4-inch brim around the entire circumference of the hat) are also recommended for sun protection.     Due to recent changes in healthcare laws, you may see results of your pathology and/or laboratory studies on MyChart before the doctors have had a chance to review them. We  understand that in some cases there may be results that are confusing or concerning to you. Please understand that not all results are received at the same time and often the doctors may need to interpret multiple results in order to provide you with the best plan of care or course of treatment. Therefore, we ask that you please give [place] 2 business days to thoroughly review all your results before contacting the office for clarification. Should we see a critical lab result, you will be contacted sooner.   If You Need Anything After Your Visit  If you have any questions or concerns for your doctor, please call our main line at [phone] and press option 4 to reach your doctor's medical assistant. If no one answers, please leave a voicemail as directed and we will return your call as soon as possible. Messages left after 4 pm will be answered the following business day.   You may also send [place] a message via MyChart. We typically respond to MyChart messages within 1-2 business days.  For prescription refills, please ask your pharmacy  to contact our office. Our fax number is [phone].  If you have an urgent issue when the clinic is closed that cannot wait until the next business day, you can page your doctor at the number below.    Please note that while we do our best to be available for urgent issues outside of office hours, we are not available 24/7.   If you have an urgent issue and are unable to reach [place], you may choose to seek medical care at your doctor's office, retail clinic, urgent care center, or emergency room.  If you have a medical emergency, please immediately call 911 or go to the emergency department.  Pager Numbers  - Dr. Gwen Pounds: [phone]  - Dr. Roseanne Reno: [phone]  - Dr. Katrinka Blazing: [phone]   In the event of inclement weather, please call our main line at [phone] for an update on the status of any delays or closures.  Dermatology Medication Tips: Please  keep the boxes that topical medications come in in order to help keep track of the instructions about where and how to use these. Pharmacies typically print the medication instructions only on the boxes and not directly on the medication tubes.   If your medication is too expensive, please contact our office at [phone] option 4 or send [place] a message through MyChart.   We are unable to tell what your co-pay for medications will be in advance as this is different depending on your insurance coverage. However, we may be able to find a substitute medication at lower cost or fill out paperwork to get insurance to cover a needed medication.   If a prior authorization is required to get your medication covered by your insurance company, please allow [place] 1-2 business days to complete this process.  Drug prices often vary depending on where the prescription is filled and some pharmacies may offer cheaper prices.  The website www.goodrx.com contains coupons for medications through different pharmacies. The prices here do not account for what the cost may be with help from insurance (it may be cheaper with your insurance), but the website can give you the price if you did not use any insurance.  - You can print the associated coupon and take it with your prescription to the pharmacy.  - You may also stop by our office during regular business hours and pick up a GoodRx coupon card.  - If you need your prescription sent electronically to a different pharmacy, notify our office through [facility] or by phone at [phone] option 4.     Si Usted Necesita Algo Despus de Su Visita  Tambin puede enviarnos un mensaje a travs de Clinical cytogeneticist. Por lo general respondemos a los mensajes de MyChart en el transcurso de 1 a 2 das hbiles.  Para renovar recetas, por favor pida a su farmacia que se ponga en contacto con nuestra oficina. Annie Sable de fax es [place] [phone].  Si tiene un asunto urgente  cuando la clnica est cerrada y que no puede esperar hasta el siguiente da hbil, puede llamar/localizar a su doctor(a) al nmero que aparece a continuacin.   Por favor, tenga en cuenta que aunque hacemos todo lo posible para estar disponibles para asuntos urgentes fuera del horario de [place], no estamos disponibles las 24 horas del da, los 7 [address]  [address] de la [place].   Si tiene un problema urgente y no puede comunicarse con nosotros, puede optar por buscar atencin mdica  en el consultorio de su doctor(a), en  una clnica privada, en un centro de atencin urgente o en una sala de emergencias.  Si tiene Engineer, drilling, por favor llame inmediatamente al 911 o vaya a la sala de emergencias.  Nmeros de bper  - Dr. Gwen Pounds: [phone]  - Dra. Roseanne Reno: [phone]  - Dr. Katrinka Blazing: [phone]   En caso de inclemencias del tiempo, por favor llame a Lacy Duverney principal al [phone] para una actualizacin sobre el [place] de cualquier retraso o cierre.  Consejos para la medicacin en dermatologa: Por favor, guarde las cajas en las que vienen los medicamentos de uso tpico para ayudarle a seguir las instrucciones sobre dnde y cmo usarlos. Las farmacias generalmente imprimen las instrucciones del medicamento slo en las cajas y no directamente en los tubos del [place].   Si su medicamento es muy caro, por favor, pngase en contacto con Rolm [place]ala llamando al [phone] y presione la opcin 4 o envenos un mensaje a travs de Clinical cytogeneticist.   No podemos decirle cul ser su copago por los medicamentos por adelantado ya que esto es diferente dependiendo de la cobertura de su seguro. Sin embargo, es posible que podamos encontrar un medicamento sustituto a Audiological scientist un formulario para que el seguro cubra el medicamento que se considera necesario.   Si se requiere una autorizacin previa para que su compaa de seguros [place] su medicamento, por favor permtanos de 1 a 2  das hbiles para completar [address]  Los precios de los medicamentos varan con frecuencia dependiendo del Environmental consultant de dnde se surte la receta y alguna farmacias pueden ofrecer precios ms baratos.  El sitio web www.goodrx.com tiene cupones para medicamentos de Health and safety inspector. Los precios aqu no tienen en cuenta lo que podra costar con la ayuda del seguro (puede ser ms barato con su seguro), pero el sitio web puede darle el precio si no utiliz Tourist information centre manager.  - Puede imprimir el cupn correspondiente y llevarlo con su receta a la farmacia.  - Tambin puede pasar por nuestra oficina durante el horario de atencin regular y Education officer, museum una tarjeta de cupones de GoodRx.  - Si necesita que su receta se enve electrnicamente a una farmacia diferente, informe a nuestra oficina a travs de MyChart de [place] o por telfono llamando al [phone] y presione la opcin 4.

## 2023-01-15 NOTE — Progress Notes (Signed)
   Follow-Up Visit   Subjective  Natalie Aguirre is a 37 y.o. female who presents for the following: Skin Cancer Screening and Full Body Skin Exam History of dysplastic   The patient presents for Total-Body Skin Exam (TBSE) for skin cancer screening and mole check. The patient has spots, moles and lesions to be evaluated, some may be new or changing and the patient may have concern these could be cancer.    The following portions of the chart were reviewed this encounter and updated as appropriate: medications, allergies, medical history  Review of Systems:  No other skin or systemic complaints except as noted in HPI or Assessment and Plan.  Objective  Well appearing patient in no apparent distress; mood and affect are within normal limits.  A full examination was performed including scalp, head, eyes, ears, nose, lips, neck, chest, axillae, abdomen, back, buttocks, bilateral upper extremities, bilateral lower extremities, hands, feet, fingers, toes, fingernails, and toenails. All findings within normal limits unless otherwise noted below.   Relevant physical exam findings are noted in the Assessment and Plan.    Assessment & Plan   SKIN CANCER SCREENING PERFORMED TODAY.  Rosacea Face  Chronic condition with duration or expected duration over one year. Currently well-controlled.  Rosacea is a chronic progressive skin condition usually affecting the face of adults, causing redness and/or acne bumps. It is treatable but not curable. It sometimes affects the eyes (ocular rosacea) as well. It may respond to topical and/or systemic medication and can flare with stress, sun exposure, alcohol, exercise, topical steroids (including hydrocortisone/cortisone 10) and some foods.  Daily application of broad spectrum spf 30+ sunscreen to face is recommended to reduce flares.   Patient deferred treatment today.   ACTINIC DAMAGE - Chronic condition, secondary to cumulative UV/sun exposure -  diffuse scaly erythematous macules with underlying dyspigmentation - Recommend daily broad spectrum sunscreen SPF 30+ to sun-exposed areas, reapply every 2 hours as needed.  - Staying in the shade or wearing long sleeves, sun glasses (UVA+UVB protection) and wide brim hats (4-inch brim around the entire circumference of the hat) are also recommended for sun protection.  - Call for new or changing lesions.  LENTIGINES, SEBORRHEIC KERATOSES, HEMANGIOMAS - Benign normal skin lesions - Benign-appearing - Call for any changes  MELANOCYTIC NEVI - Tan-brown and/or pink-flesh-colored symmetric macules and papules - Benign appearing on exam today - Observation - Call clinic for new or changing moles - Recommend daily use of broad spectrum spf 30+ sunscreen to sun-exposed areas.   HISTORY OF DYSPLASTIC NEVUS See history  Right inferior scapula severe excised 03/2022 Right breast, right lateral tricep, right mid back paraspinal  01/2022 No evidence of recurrence today Recommend regular full body skin exams Recommend daily broad spectrum sunscreen SPF 30+ to sun-exposed areas, reapply every 2 hours as needed.  Call if any new or changing lesions are noted between office visits   Return in about 1 year (around 01/15/2024) for TBSE.  IAsher Muir, CMA, am acting as scribe for Armida Sans, MD.   Documentation: I have reviewed the above documentation for accuracy and completeness, and I agree with the above.  Armida Sans, MD

## 2023-01-22 ENCOUNTER — Encounter: Payer: Self-pay | Admitting: Dermatology

## 2023-08-30 ENCOUNTER — Encounter: Payer: Self-pay | Admitting: Emergency Medicine

## 2023-08-30 ENCOUNTER — Ambulatory Visit
Admission: EM | Admit: 2023-08-30 | Discharge: 2023-08-30 | Disposition: A | Discharge status: Home / Self Care | Attending: Emergency Medicine | Admitting: Emergency Medicine

## 2023-08-30 DIAGNOSIS — J020 Streptococcal pharyngitis: Secondary | ICD-10-CM | POA: Insufficient documentation

## 2023-08-30 LAB — GROUP A STREP BY PCR: Group A Strep by PCR: DETECTED — AB

## 2023-08-30 MED ORDER — AMOXICILLIN-POT CLAVULANATE 875-125 MG PO TABS: 1.0000 | ORAL_TABLET | Freq: Two times a day (BID) | ORAL | 0 refills | Status: AC

## 2023-08-30 MED ORDER — LIDOCAINE VISCOUS HCL 2 % MT SOLN: 15.0000 mL | Freq: Four times a day (QID) | OROMUCOSAL | 0 refills | Status: AC | PRN

## 2023-08-30 NOTE — Discharge Instructions (Signed)
 Take the Augmentin twice daily for 10 days for treatment of your strep throat.  Gargle with warm salt water 2-3 times a day to soothe your throat, aid in pain relief, and aid in healing.  Take over-the-counter Tylenol  and/or ibuprofen according to the package instructions as needed for pain.  You may gargle and spit 15 mL of viscous lidocaine  20 minutes before meals and at bedtime as needed for pain.  You can also use Chloraseptic or Sucrets lozenges, 1 lozenge every 2 hours as needed for throat pain.  If you develop any new or worsening symptoms return for reevaluation.

## 2023-08-30 NOTE — ED Triage Notes (Signed)
 Patient presents with c/o sore throat since last night.

## 2023-08-30 NOTE — ED Provider Notes (Signed)
 [facility]    CSN: [id number] Arrival date & time: 08/30/23  1234      History   Chief Complaint Chief Complaint  Patient presents with   Sore Throat    HPI Natalie Aguirre is a 38 y.o. female.   HPI  38 year old female with no significant past medical history presents for evaluation of sore throat that began last night.  She denies any fever, runny nose, nasal congestion, or cough.  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  Past Medical History:  Diagnosis Date   Dysplastic nevus 02/19/2022   R breast, Moderate   Dysplastic nevus 02/19/2022   R inf scapula severe. Excised 03/20/2022   Dysplastic nevus 02/19/2022   R lat tricep, moderate   Dysplastic nevus 02/19/2022   R mid back paraspinal, mild   Medical history non-contributory     Patient Active Problem List   Diagnosis Date Noted   Breast mass, right 04/12/2014    Past Surgical History:  Procedure Laterality Date   CERVICAL CONIZATION W/BX N/A 01/29/2020   Procedure: COLD KNIFE CONIZATION CERVIX WITH BIOPSY;  Surgeon: Ward, Margarie Shay, MD;  Location: [facility] OR[facility];  Service: Gynecology;  Laterality: N/A;   NO PAST SURGERIES      OB History     Gravida  3   Para  2   Term      Preterm      AB      Living  2      SAB      IAB      Ectopic      Multiple      Live Births           Obstetric Comments  1st Menstrual Cycle:  16 1st Pregnancy: 20           Home Medications    Prior to Admission medications   Medication Sig Start Date End Date Taking? Authorizing Provider  amoxicillin-clavulanate (AUGMENTIN) 875-125 MG tablet Take 1 tablet by mouth every 12 (twelve) hours for 10 days. 08/30/23 09/09/23 Yes Kent Pear, NP  lidocaine  (XYLOCAINE ) 2 % solution Use as directed 15 mLs in the mouth or throat every 6 (six) hours as needed for mouth pain. 08/30/23  Yes Kent Pear, NP  levonorgestrel (MIRENA) 20 MCG/24HR IUD 1  each by Intrauterine route once.    [provider]    Family History Family History  Problem Relation Age of Onset   Cancer Maternal Aunt 83       great aunt    Social History Social History   Tobacco Use   Smoking status: Never   Smokeless tobacco: Never  Vaping Use   Vaping status: Never Used  Substance Use Topics   Alcohol use: Not Currently    Comment: occasional    Drug use: No     Allergies   Codeine   Review of Systems Review of Systems  Constitutional:  Negative for fever.  HENT:  Positive for sore throat. Negative for congestion, ear pain and rhinorrhea.   Respiratory:  Negative for cough.      Physical Exam Triage Vital Signs ED Triage Vitals  Encounter Vitals Group     BP 08/30/23 1241 127/84     Systolic BP Percentile --      Diastolic BP Percentile --      Pulse Rate 08/30/23 1241 (!) 102     Resp  08/30/23 1241 18     Temp 08/30/23 1241 99.3 F (37.4 C)     Temp Source 08/30/23 1241 Oral     SpO2 08/30/23 1241 98 %     Weight --      Height --      Head Circumference --      Peak Flow --      Pain Score 08/30/23 1240 0     Pain Loc --      Pain Education --      Exclude from Growth Chart --    No data found.  Updated Vital Signs BP 127/84 (BP Location: Left Arm)   Pulse (!) 102   Temp 99.3 F (37.4 C) (Oral)   Resp 18   SpO2 98%   Visual Acuity Right Eye Distance:   Left Eye Distance:   Bilateral Distance:    Right Eye Near:   Left Eye Near:    Bilateral Near:     Physical Exam Vitals and nursing note reviewed.  Constitutional:      Appearance: Normal appearance. She is not ill-appearing.  HENT:     Head: Normocephalic and atraumatic.     Mouth/Throat:     Mouth: Mucous membranes are moist.     Pharynx: Oropharyngeal exudate and posterior oropharyngeal erythema present.     Comments: Bilateral tonsillar pillars are erythematous and 1+ edematous.  There is white exudate present on the left tonsillar  pillar. Neck:     Comments: Bilateral anterior, nontender cervical lymphadenopathy present. Musculoskeletal:     Cervical back: Normal range of motion and neck supple. No tenderness.  Lymphadenopathy:     Cervical: Cervical adenopathy present.  Skin:    General: Skin is warm and dry.     Capillary Refill: Capillary refill takes less than 2 seconds.     Findings: No rash.  Neurological:     General: No focal deficit present.     Mental Status: She is alert and oriented to person, place, and time.      UC Treatments / Results  Labs (all labs ordered are listed, but only abnormal results are displayed) Labs Reviewed  GROUP A STREP BY PCR - Abnormal; Notable for the following components:      Result Value   Group A Strep by PCR DETECTED (*)    All other components within normal limits    EKG   Radiology No results found.  Procedures Procedures (including critical care time)  Medications Ordered in UC Medications - No data to display  Initial Impression / Assessment and Plan / UC Course  I have reviewed the triage vital signs and the nursing notes.  Pertinent labs & imaging results that were available during my care of the patient were reviewed by me and considered in my medical decision making (see chart for details).   Patient is a pleasant, nontoxic-appearing 38 year old female presenting for evaluation of sore throat as outlined in HPI above.  She does have exudate on her left tonsillar pillar and both tonsillar pillars are edematous and erythematous.  No other associated upper or lower respiratory symptoms.  I will order a strep PCR.  Strep PCR is positive.  I will discharge patient home with a diagnosis of strep pharyngitis and started on Augmentin  875 mg twice daily with food for 10 days.  Also salt water gargles and over-the-counter Chloraseptic or Sucrets lozenges as needed for sore throat pain.  Additionally, I will prescribe some viscous lidocaine  and she  can  gargle and spit 15 mL 20 minutes before meals and at bedtime as needed.   Final Clinical Impressions(s) / UC Diagnoses   Final diagnoses:  Strep pharyngitis     Discharge Instructions      Take the Augmentin  twice daily for 10 days for treatment of your strep throat.  Gargle with warm salt water 2-3 times a day to soothe your throat, aid in pain relief, and aid in healing.  Take over-the-counter Tylenol  and/or ibuprofen according to the package instructions as needed for pain.  You may gargle and spit 15 mL of viscous lidocaine  20 minutes before meals and at bedtime as needed for pain.  You can also use Chloraseptic or Sucrets lozenges, 1 lozenge every 2 hours as needed for throat pain.  If you develop any new or worsening symptoms return for reevaluation.    ED Prescriptions     Medication Sig Dispense Auth. Provider   amoxicillin -clavulanate (AUGMENTIN ) 875-125 MG tablet Take 1 tablet by mouth every 12 (twelve) hours for 10 days. 20 tablet Kent Pear, NP   lidocaine  (XYLOCAINE ) 2 % solution Use as directed 15 mLs in the mouth or throat every 6 (six) hours as needed for mouth pain. 100 mL Kent Pear, NP      PDMP not reviewed this encounter.   Kent Pear, NP 08/30/23 1320

## 2024-01-16 ENCOUNTER — Ambulatory Visit: Payer: BC Managed Care – PPO | Admitting: Dermatology
